# Patient Record
Sex: Female | Born: 1990 | Hispanic: Yes | Marital: Single | Smoking: Former smoker
Health system: Southern US, Community
[De-identification: ages and names within clinical notes are randomized; demographics above are authoritative.]

---

## 2010-01-05 ENCOUNTER — Emergency Department (HOSPITAL_COMMUNITY): Admission: EM | Admit: 2010-01-05 | Discharge: 2010-01-06 | Payer: Self-pay | Admitting: Emergency Medicine

## 2010-01-06 ENCOUNTER — Inpatient Hospital Stay (HOSPITAL_COMMUNITY): Admission: EM | Admit: 2010-01-06 | Discharge: 2010-01-11 | Payer: Self-pay | Admitting: Psychiatry

## 2010-01-06 ENCOUNTER — Ambulatory Visit: Payer: Self-pay | Admitting: Psychiatry

## 2010-11-29 LAB — URINALYSIS, ROUTINE W REFLEX MICROSCOPIC
Hgb urine dipstick: NEGATIVE
Nitrite: NEGATIVE
Specific Gravity, Urine: 1.017 (ref 1.005–1.030)
Urobilinogen, UA: 0.2 mg/dL (ref 0.0–1.0)

## 2010-11-29 LAB — BASIC METABOLIC PANEL
BUN: 8 mg/dL (ref 6–23)
Chloride: 104 mEq/L (ref 96–112)
Glucose, Bld: 84 mg/dL (ref 70–99)
Potassium: 4 mEq/L (ref 3.5–5.1)

## 2010-11-29 LAB — HEPATIC FUNCTION PANEL
AST: 22 U/L (ref 0–37)
Albumin: 3.7 g/dL (ref 3.5–5.2)
Alkaline Phosphatase: 58 U/L (ref 39–117)
Total Protein: 7.2 g/dL (ref 6.0–8.3)

## 2010-11-29 LAB — RAPID URINE DRUG SCREEN, HOSP PERFORMED: Cocaine: NOT DETECTED

## 2010-11-29 LAB — PREGNANCY, URINE: Preg Test, Ur: NEGATIVE

## 2010-11-29 LAB — URINE MICROSCOPIC-ADD ON

## 2010-11-29 LAB — DIFFERENTIAL
Eosinophils Absolute: 0.8 10*3/uL — ABNORMAL HIGH (ref 0.0–0.7)
Eosinophils Relative: 6 % — ABNORMAL HIGH (ref 0–5)
Lymphs Abs: 4 10*3/uL (ref 0.7–4.0)
Monocytes Absolute: 0.6 10*3/uL (ref 0.1–1.0)

## 2010-11-29 LAB — CBC
HCT: 38.8 % (ref 36.0–46.0)
MCV: 81.2 fL (ref 78.0–100.0)
Platelets: 395 10*3/uL (ref 150–400)
RDW: 13.6 % (ref 11.5–15.5)

## 2010-11-29 LAB — TRICYCLICS SCREEN, URINE: TCA Scrn: NOT DETECTED

## 2010-11-29 LAB — TSH: TSH: 3.472 u[IU]/mL (ref 0.350–4.500)

## 2016-04-08 ENCOUNTER — Emergency Department (HOSPITAL_COMMUNITY)
Admission: EM | Admit: 2016-04-08 | Discharge: 2016-04-08 | Disposition: A | Discharge status: Home / Self Care | Payer: BLUE CROSS/BLUE SHIELD | Attending: Emergency Medicine | Admitting: Emergency Medicine

## 2016-04-08 ENCOUNTER — Emergency Department (HOSPITAL_COMMUNITY): Payer: BLUE CROSS/BLUE SHIELD

## 2016-04-08 ENCOUNTER — Encounter (HOSPITAL_COMMUNITY): Payer: Self-pay

## 2016-04-08 DIAGNOSIS — Y939 Activity, unspecified: Secondary | ICD-10-CM | POA: Diagnosis not present

## 2016-04-08 DIAGNOSIS — S8002XA Contusion of left knee, initial encounter: Secondary | ICD-10-CM

## 2016-04-08 DIAGNOSIS — F17200 Nicotine dependence, unspecified, uncomplicated: Secondary | ICD-10-CM | POA: Diagnosis not present

## 2016-04-08 DIAGNOSIS — Y999 Unspecified external cause status: Secondary | ICD-10-CM | POA: Insufficient documentation

## 2016-04-08 DIAGNOSIS — S8992XA Unspecified injury of left lower leg, initial encounter: Secondary | ICD-10-CM | POA: Diagnosis present

## 2016-04-08 DIAGNOSIS — W1839XA Other fall on same level, initial encounter: Secondary | ICD-10-CM | POA: Insufficient documentation

## 2016-04-08 DIAGNOSIS — Y9289 Other specified places as the place of occurrence of the external cause: Secondary | ICD-10-CM | POA: Diagnosis not present

## 2016-04-08 MED ORDER — IBUPROFEN 800 MG PO TABS
800.0000 mg | ORAL_TABLET | Freq: Once | ORAL | Status: AC
  Administered 2016-04-08: 800 mg via ORAL
  Filled 2016-04-08: qty 1

## 2016-04-08 MED ORDER — IBUPROFEN 800 MG PO TABS: 800.0000 mg | ORAL_TABLET | Freq: Three times a day (TID) | ORAL | 0 refills | Status: DC
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
## 2016-10-15 ENCOUNTER — Emergency Department (HOSPITAL_COMMUNITY): Payer: BLUE CROSS/BLUE SHIELD

## 2016-10-15 ENCOUNTER — Encounter (HOSPITAL_COMMUNITY): Payer: Self-pay

## 2016-10-15 ENCOUNTER — Emergency Department (HOSPITAL_COMMUNITY)
Admission: EM | Admit: 2016-10-15 | Discharge: 2016-10-15 | Disposition: A | Discharge status: Home / Self Care | Payer: BLUE CROSS/BLUE SHIELD | Attending: Emergency Medicine | Admitting: Emergency Medicine

## 2016-10-15 DIAGNOSIS — S8992XA Unspecified injury of left lower leg, initial encounter: Secondary | ICD-10-CM | POA: Diagnosis present

## 2016-10-15 DIAGNOSIS — F17200 Nicotine dependence, unspecified, uncomplicated: Secondary | ICD-10-CM | POA: Insufficient documentation

## 2016-10-15 DIAGNOSIS — S9304XA Dislocation of right ankle joint, initial encounter: Secondary | ICD-10-CM | POA: Diagnosis not present

## 2016-10-15 DIAGNOSIS — Z79899 Other long term (current) drug therapy: Secondary | ICD-10-CM | POA: Diagnosis not present

## 2016-10-15 DIAGNOSIS — S8252XA Displaced fracture of medial malleolus of left tibia, initial encounter for closed fracture: Secondary | ICD-10-CM | POA: Diagnosis not present

## 2016-10-15 DIAGNOSIS — Y939 Activity, unspecified: Secondary | ICD-10-CM | POA: Diagnosis not present

## 2016-10-15 DIAGNOSIS — W010XXA Fall on same level from slipping, tripping and stumbling without subsequent striking against object, initial encounter: Secondary | ICD-10-CM | POA: Diagnosis not present

## 2016-10-15 DIAGNOSIS — S82892A Other fracture of left lower leg, initial encounter for closed fracture: Secondary | ICD-10-CM

## 2016-10-15 DIAGNOSIS — Y999 Unspecified external cause status: Secondary | ICD-10-CM | POA: Insufficient documentation

## 2016-10-15 DIAGNOSIS — T148XXA Other injury of unspecified body region, initial encounter: Secondary | ICD-10-CM

## 2016-10-15 DIAGNOSIS — S9305XA Dislocation of left ankle joint, initial encounter: Secondary | ICD-10-CM

## 2016-10-15 DIAGNOSIS — Y929 Unspecified place or not applicable: Secondary | ICD-10-CM | POA: Insufficient documentation

## 2016-10-15 MED ORDER — SODIUM CHLORIDE 0.9 % IV BOLUS (SEPSIS)
500.0000 mL | Freq: Once | INTRAVENOUS | Status: AC
  Administered 2016-10-15: 500 mL via INTRAVENOUS

## 2016-10-15 MED ORDER — KETAMINE HCL 10 MG/ML IJ SOLN
INTRAMUSCULAR | Status: DC | PRN
  Administered 2016-10-15: 110 mg via INTRAVENOUS

## 2016-10-15 MED ORDER — FENTANYL CITRATE (PF) 100 MCG/2ML IJ SOLN
50.0000 ug | Freq: Once | INTRAMUSCULAR | Status: AC
  Administered 2016-10-15: 50 ug via INTRAVENOUS
  Filled 2016-10-15: qty 2

## 2016-10-15 MED ORDER — HYDROCODONE-ACETAMINOPHEN 5-325 MG PO TABS: 1.0000 | ORAL_TABLET | Freq: Four times a day (QID) | ORAL | 0 refills | Status: DC | PRN

## 2016-10-15 MED ORDER — SODIUM CHLORIDE 0.9 % IV BOLUS (SEPSIS)
1000.0000 mL | Freq: Once | INTRAVENOUS | Status: AC
  Administered 2016-10-15: 1000 mL via INTRAVENOUS

## 2016-10-15 MED ORDER — PROPOFOL 10 MG/ML IV BOLUS: 0.5000 mg/kg | INTRAVENOUS | Status: DC | PRN

## 2016-10-15 MED ORDER — NAPROXEN 500 MG PO TABS: 500.0000 mg | ORAL_TABLET | Freq: Two times a day (BID) | ORAL | 0 refills | Status: DC

## 2016-10-15 MED ORDER — KETAMINE HCL-SODIUM CHLORIDE 100-0.9 MG/10ML-% IV SOSY
1.0000 mg/kg | PREFILLED_SYRINGE | Freq: Once | INTRAVENOUS | Status: AC
  Administered 2016-10-15: 113 mg via INTRAVENOUS
  Filled 2016-10-15: qty 20

## 2016-10-15 NOTE — Discharge Instructions (Signed)
Use the crutches, do not put weight on your foot, keep your leg elevated, apply ice to help with the swelling, take pain medications as needed, follow up with Dr Ranell PatrickNorris this week,

## 2016-10-15 NOTE — ED Triage Notes (Signed)
She states she fell this morning (didn't pass out) and doesn't recall exactly how she lost her balance. She c/o left ankle pain; which shows some swelling. CMS intact all toes bilat.

## 2016-10-15 NOTE — ED Notes (Signed)
Permit is signed; and pt. Remains in no distress. We are awaiting the arrival of the ortho. Tech. To begin the procedure.

## 2016-10-15 NOTE — ED Notes (Signed)
Bed: [id number] Expected date:  Expected time:  Means of arrival:  Comments: EMS Ankle injury

## 2016-10-15 NOTE — ED Provider Notes (Signed)
[facility] Provider Note   CSN: [id number] Arrival date & time: 10/15/16  0759     History   Chief Complaint Chief Complaint  Patient presents with  . Ankle Pain    HPI Stephanie Bryan is a 26 y.o. female.  HPI Patient presents to the emergency room with complaints of left ankle pain. The patient was walking this morning when she thinks she just slipped.  She did not hit her head, lose consciousness.  She fell to the ground injuring her left ankle.  Pt noted a deformity and could not stand.  She had to call EMS.  She denies any other injuries.  No LOC.  NO neck pain.  No chest pain.  No abdominal pain.  No numbness or weakness. No past medical history on file.  There are no active problems to display for this patient.   History reviewed. No pertinent surgical history.  OB History    No data available       Home Medications    Prior to Admission medications   Medication Sig Start Date End Date Taking? Authorizing Provider  HYDROcodone-acetaminophen (NORCO/VICODIN) 5-325 MG tablet Take 1 tablet by mouth every 6 (six) hours as needed. 10/15/16   Linwood Dibbles, MD  naproxen (NAPROSYN) 500 MG tablet Take 1 tablet (500 mg total) by mouth 2 (two) times daily. 10/15/16   Linwood Dibbles, MD    Family History No family history on file.  Social History Social History  Substance Use Topics  . Smoking status: Current Some Day Smoker  . Smokeless tobacco: Never Used  . Alcohol use Yes     Comment: socially     Allergies   Patient has no known allergies.   Review of Systems Review of Systems  All other systems reviewed and are negative.    Physical Exam Updated Vital Signs BP 150/96 (BP Location: Left Arm)   Pulse 110   Temp 97.8 F (36.6 C) (Oral)   Resp 17   Ht 5\' 1"  (1.549 m)   Wt 113.4 kg   SpO2 100%   BMI 47.24 kg/m   Physical Exam  Constitutional: She appears well-developed and well-nourished. No distress.  HENT:  Head: Normocephalic and  atraumatic.  Right Ear: External ear normal.  Left Ear: External ear normal.  Eyes: Conjunctivae are normal. Right eye exhibits no discharge. Left eye exhibits no discharge. No scleral icterus.  Neck: Neck supple. No tracheal deviation present.  Cardiovascular: Normal rate, regular rhythm and normal heart sounds.   Pulmonary/Chest: Effort normal. No stridor. No respiratory distress. She has no wheezes.  Abdominal: She exhibits no distension. There is no tenderness. There is no guarding.  Musculoskeletal: She exhibits no edema.       Left knee: Normal.       Left ankle: She exhibits swelling and deformity. Tenderness. Lateral malleolus and medial malleolus tenderness found.       Left foot: Normal.  Edema and deformity distal tib fib, left ankle region  Neurological: She is alert. Cranial nerve deficit: no gross deficits.  Skin: Skin is warm and dry. No rash noted.  Psychiatric: She has a normal mood and affect.  Nursing note and vitals reviewed.    ED Treatments / Results     Radiology Dg Ankle Complete Left  Result Date: 10/15/2016 CLINICAL DATA:  26 year old female status post fall this morning with left ankle pain and swelling. Initial encounter. EXAM: LEFT ANKLE COMPLETE - 3+ VIEW COMPARISON:  None. FINDINGS: Fracture  dislocation at the left mortise joint. Lateral and slightly posterior dislocation of the ankle joint in association with diastases of the tibiofibular syndesmosis and displaced comminuted fracture of the medial malleolus. Superimposed comminuted fracture of the left fibula distal third shaft with 1 full shaft width medial displacement, lateral angulation, and displaced butterfly fragment. The talar dome, lateral malleolus, and posterior malleolus appear to remain intact. Calcaneus appears intact. Other visualized left foot osseous structures appear intact. IMPRESSION: 1. Lateral and slightly posterior fracture-dislocation of the left ankle. 2. Disruption of the  tibiofibular syndesmosis. Comminuted and displaced fracture of the medial malleolus. Comminuted, displaced and angulated fracture of the distal third shaft of the left fibula. Electronically Signed   By: Odessa Fleming M.D.   On: 10/15/2016 08:24    Procedures .Sedation Date/Time: 10/15/2016 9:00 AM Performed by: Linwood Dibbles Authorized by: Linwood Dibbles   Consent:    Consent obtained:  Verbal   Consent given by:  Patient   Risks discussed:  Allergic reaction, dysrhythmia, inadequate sedation, nausea, vomiting, respiratory compromise necessitating ventilatory assistance and intubation, prolonged sedation necessitating reversal and prolonged hypoxia resulting in organ damage   Alternatives discussed:  Analgesia without sedation Indications:    Procedure performed:  Dislocation reduction   Procedure necessitating sedation performed by:  Physician performing sedation   Intended level of sedation:  Deep Pre-sedation assessment:    Time since last food or drink:  12 hours   ASA classification: class 1 - normal, healthy patient     Neck mobility: normal     Mouth opening:  2 finger widths   Thyromental distance:  3 finger widths   Mallampati score:  II - soft palate, uvula, fauces visible   Pre-sedation assessments completed and reviewed: airway patency, cardiovascular function, hydration status, mental status, nausea/vomiting, pain level, respiratory function and temperature     History of difficult intubation: no     Pre-sedation assessment completed:  10/15/2016 9:02 AM Immediate pre-procedure details:    Reassessment: Patient reassessed immediately prior to procedure     Reviewed: vital signs and NPO status     Verified: bag valve mask available, emergency equipment available, intubation equipment available, IV patency confirmed, oxygen available, reversal medications available and suction available   Procedure details (see MAR for exact dosages):    Sedation start time:  10/15/2016 10:14 AM    Preoxygenation:  Nasal cannula   Sedation:  Ketamine   Intra-procedure monitoring:  Blood pressure monitoring, cardiac monitor, continuous pulse oximetry, continuous capnometry, frequent LOC assessments and frequent vital sign checks   Intra-procedure events: none     Sedation end time:  10/15/2016 10:34 AM Post-procedure details:    Post-sedation assessment completed:  10/15/2016 11:09 AM   Attendance: Constant attendance by certified staff until patient recovered     Recovery: Patient returned to pre-procedure baseline     Estimated blood loss (see I/O flowsheets): no     Post-sedation assessments completed and reviewed: airway patency, cardiovascular function, hydration status, mental status, nausea/vomiting, pain level, respiratory function and temperature     Specimens recovered:  None   Patient is stable for discharge or admission: yes     Patient tolerance:  Tolerated well, no immediate complications     (including critical care time)  Medications Ordered in ED Medications  ketamine 100 mg in normal saline 10 mL (10mg /mL) syringe (not administered)  ketamine (KETALAR) injection (110 mg Intravenous Given 10/15/16 1019)  sodium chloride 0.9 % bolus 500 mL (500 mLs Intravenous New  Bag/Given 10/15/16 0935)  fentaNYL (SUBLIMAZE) injection 50 mcg (50 mcg Intravenous Given 10/15/16 0935)  sodium chloride 0.9 % bolus 1,000 mL (1,000 mLs Intravenous New Bag/Given 10/15/16 0936)     Initial Impression / Assessment and Plan / ED Course  I have reviewed the triage vital signs and the nursing notes.  Pertinent labs & imaging results that were available during my care of the patient were reviewed by me and considered in my medical decision making (see chart for details).  Clinical Course as of Oct 15 1110  Wynelle LinkSun Oct 15, 2016  [id number] Fracture dislocated noted.  Will need reduction.  Plan on sedation.  Discussed propofol and ketamine.  Pt is concerned that ketamine may not be effective since she has used  that recreationally in the past.   Will repeat BP after fluid bolus  [JK]  0947 BP is back in the mid 90s.  Pt has chronically low BP.  Will change to ketamine instead of propofol  [JK]    Clinical Course User Index [JK] Linwood DibblesJon Nataniel Gasper, MD    Patient sustained a fracture dislocation of her left ankle after a fall this morning.  Patient underwent procedural sedation and I was able to reduce the fracture dislocation.  I spoke with Alphonsa OverallBrad Dixon and the patient will follow up with Dr Ranell PatrickNorris this week.  Final Clinical Impressions(s) / ED Diagnoses   Final diagnoses:  Dislocation closed  Ankle dislocation, left, initial encounter  Closed fracture of left ankle, initial encounter    New Prescriptions New Prescriptions   HYDROCODONE-ACETAMINOPHEN (NORCO/VICODIN) 5-325 MG TABLET    Take 1 tablet by mouth every 6 (six) hours as needed.   NAPROXEN (NAPROSYN) 500 MG TABLET    Take 1 tablet (500 mg total) by mouth 2 (two) times daily.     Linwood DibblesJon Harry Bark, MD 10/15/16 1113

## 2018-06-17 ENCOUNTER — Ambulatory Visit: Payer: Self-pay | Admitting: Psychiatry

## 2018-06-24 ENCOUNTER — Encounter: Payer: Self-pay | Admitting: Emergency Medicine

## 2018-06-24 DIAGNOSIS — F321 Major depressive disorder, single episode, moderate: Secondary | ICD-10-CM | POA: Insufficient documentation

## 2018-06-24 DIAGNOSIS — F411 Generalized anxiety disorder: Secondary | ICD-10-CM

## 2018-06-24 DIAGNOSIS — G4700 Insomnia, unspecified: Secondary | ICD-10-CM

## 2018-07-01 ENCOUNTER — Ambulatory Visit: Payer: BLUE CROSS/BLUE SHIELD | Admitting: Physician Assistant

## 2018-07-01 ENCOUNTER — Ambulatory Visit: Payer: BLUE CROSS/BLUE SHIELD | Admitting: Psychiatry

## 2018-07-01 ENCOUNTER — Encounter: Payer: Self-pay | Admitting: Physician Assistant

## 2018-07-01 DIAGNOSIS — F411 Generalized anxiety disorder: Secondary | ICD-10-CM

## 2018-07-01 DIAGNOSIS — F331 Major depressive disorder, recurrent, moderate: Secondary | ICD-10-CM

## 2018-07-01 DIAGNOSIS — F321 Major depressive disorder, single episode, moderate: Secondary | ICD-10-CM | POA: Diagnosis not present

## 2018-07-01 MED ORDER — BUPROPION HCL ER (XL) 300 MG PO TB24: 300.0000 mg | ORAL_TABLET | Freq: Every day | ORAL | 1 refills | Status: DC

## 2018-07-01 MED ORDER — VORTIOXETINE HBR 10 MG PO TABS: 10.0000 mg | ORAL_TABLET | ORAL | 0 refills | Status: DC

## 2018-07-01 MED ORDER — HYDROXYZINE HCL 10 MG PO TABS: ORAL_TABLET | ORAL | 1 refills | Status: DC

## 2018-07-01 NOTE — Progress Notes (Signed)
[facility] Med Check  Patient ID: Stephanie Bryan,  MRN: [id number]  PCP: Patient, No Pcp Per  Date of Evaluation: 07/01/2018 Time spent:15 minutes   HISTORY/CURRENT STATUS: HPI About 25% better than LOV.  States she still cries easily on occasion and has low energy and motivation.  She does continue to work without missing days however.  She has to push herself to go out and half on but when she gets to a party or wherever, she does enjoy it.  She is sleeping well with the trazodone.  On nights that she does not sleep well, she is more anxious the next day.  She does often have a generalized sense of anxiety with uneasiness inside.  No shortness of breath but does have palpitations occasionally.  No sweats.  No increased energy with decreased need for sleep, no risky or impulsive behaviors.  No increased libido, no increased sleep, no grandiosity, no increased irritability.  She is seeing Wynelle Bourgeois, [facility] in counseling which is helpful.  Individual Medical History/ Review of Systems: Changes? :No  Allergies: Patient has no known allergies.  Current Medications:  Current Outpatient Medications:  .  traZODone (DESYREL) 50 MG tablet, Take 50 mg by mouth at bedtime as needed for sleep., Disp: , Rfl:  .  vortioxetine HBr (TRINTELLIX) 10 MG TABS tablet, Take 1 tablet (10 mg total) by mouth every morning., Disp: 90 tablet, Rfl: 0 .  buPROPion (WELLBUTRIN XL) 300 MG 24 hr tablet, Take 1 tablet (300 mg total) by mouth daily., Disp: 30 tablet, Rfl: 1 .  HYDROcodone-acetaminophen (NORCO/VICODIN) 5-325 MG tablet, Take 1 tablet by mouth every 6 (six) hours as needed. (Patient not taking: Reported on 07/01/2018), Disp: 12 tablet, Rfl: 0 .  hydrOXYzine (ATARAX/VISTARIL) 10 MG tablet, 1-2 po tid prn anxiety, Disp: 90 tablet, Rfl: 1 .  naproxen (NAPROSYN) 500 MG tablet, Take 1 tablet (500 mg total) by mouth 2 (two) times daily., Disp: 20 tablet, Rfl: 0 Medication Side Effects: None  Family  Medical/ Social History: Changes? No  MENTAL HEALTH EXAM:  There were no vitals taken for this visit.There is no height or weight on file to calculate BMI.  General Appearance: Well Groomed obese  Eye Contact:  Good  Speech:  Clear and Coherent  Volume:  Normal  Mood:  Depressed  Affect:  Depressed  Thought Process:  Goal Directed  Orientation:  Full (Time, Place, and Person)  Thought Content: Logical   Suicidal Thoughts:  No  Homicidal Thoughts:  No  Memory:  Immediate  Judgement:  Good  Insight:  Good  Psychomotor Activity:  Normal  Concentration:  Concentration: Good  Recall:  Good  Fund of Knowledge: Good  Language: Good  Akathisia:  No  AIMS (if indicated): not done  Assets:  Desire for Improvement  ADL's:  Intact  Cognition: WNL  Prognosis:  Good    DIAGNOSES:    ICD-10-CM   1. Major depressive disorder, recurrent episode, moderate (HCC) F33.1   2. Generalized anxiety disorder F41.1     RECOMMENDATIONS: We will increase Wellbutrin XL 300 mg every morning.  Hopefully this will help with the motivation and energy especially.  There is a possibility it might increase anxiety however and she knows to watch for that.  Will start hydroxyzine for as needed use for anxiety.  She will call if symptoms occur. Continue psychotherapy with Wynelle Bourgeois, LPC Return in 4 to 6 weeks.    Melony Overly, PA-C

## 2018-07-01 NOTE — Progress Notes (Signed)
      [facility] Counselor/Therapist Progress Note   Patient ID: Bellarose Alonzo Loving, MRN: [id number]  Date: 07/01/2018  Timespent: 50 minutes  Treatment Type: Individual  Subjective: The client reports that she has restarted her meds of Trintellix and Wellbutrin.  Today she is not sure how she feels.  It has been about 26 days that she is been on the meds.  We discussed that a probably another week or so before she really notices a difference.  The client has been having thoughts of death but nothing identified as suicidal thoughts.  Her negative belief is, "things are not going to be okay", which generates a sense of despair and fear that she feels in her throat.  We used eye movement today to begin to process through those emotions that she rated at a subjective units of distress 5 and ended at less than 2.  The client sees that she needs to do things to replenish herself and do self-care.  She finds that she gives out too much to others then finds herself drained and overwhelmed.  She needs to discern what she has control over and what she does not have control over (international events, Psychologist, clinical).  The client will do some gardening, spend time with friends and her sister.  She will also begin to work on addressing her spiritual needs.  Interventions:CBT, Solution Focused, Psychosocial Skills: Boundaries, Supportive, Reframing and Other: EMDR  Mental Status Exam:   Appearance:   Casual     Behavior:  Appropriate  Motor:  Normal  Speech/Language:   Clear and Coherent  Affect:  Appropriate  Mood:  anxious and depressed  Thought process:  Coherent  Thought content:    Logical  Perceptual disturbances:    Normal  Orientation:  Full (Time, Place, and Person)  Attention:  Good  Concentration:  good  Memory:  Immediate  Fund of knowledge:   Good  Insight:    Good  Judgment:   Good  Impulse Control:  good    Reported Symptoms: Sad, anxious  Risk Assessment: Danger to  Self:  No Self-injurious Behavior: No Danger to Others: No Duty to Warn:no Physical Aggression / Violence:No  Access to Firearms a concern: No  Gang Involvement:No   Diagnosis:   ICD-10-CM   1. Major depressive disorder, single episode, moderate (HCC) F32.1      Plan: Self-care, exercise, continue with med's.  Gelene Mink Starsha [place], [place]

## 2018-07-29 ENCOUNTER — Ambulatory Visit: Payer: BLUE CROSS/BLUE SHIELD | Admitting: Psychiatry

## 2018-07-29 ENCOUNTER — Encounter: Payer: Self-pay | Admitting: Psychiatry

## 2018-07-29 DIAGNOSIS — F331 Major depressive disorder, recurrent, moderate: Secondary | ICD-10-CM

## 2018-07-29 NOTE — Progress Notes (Signed)
      [facility] Counselor/Therapist Progress Note   Patient ID: Lisabeth Mia CreekLucia Belleau, MRN: [id number]  Date: 07/29/2018    Timespent: 56 minutes    Treatment Type: Individual   Reported Symptoms: Sleep disturbance, Isolation and withdrawal, Fatigue and Lack of motivation   Mental Status Exam:    Appearance:   Casual     Behavior:  Appropriate  Motor:  Normal  Speech/Language:   Clear and Coherent  Affect:  Appropriate  Mood:  depressed and sad  Thought process:  normal  Thought content:    WNL  Sensory/Perceptual disturbances:    WNL  Orientation:  oriented to person, place, time/date and situation  Attention:  Good  Concentration:  Good  Memory:  WNL  Fund of knowledge:   Good  Insight:    Good  Judgment:   Good  Impulse Control:  Good     Risk Assessment: Danger to Self:  No Self-injurious Behavior: No Danger to Others: No Duty to Warn:no Physical Aggression / Violence:No  Access to Firearms a concern: No  Gang Involvement:No    Subjective: The client reports that the time change has been manageable for her.  She states, "I have done okay."  She notes it is still hard to do things.  Her sleep has not been that great.  She states, "if I take my trazodone I sleep better."  We discussed that the clients low mood Saban Heinlen be related to her lack of sleep.  I requested to the client that she take her trazodone each night for the next 7 days and try to get at least 7 hours worth of sleep.  She agreed to do so. She reports that trintillix helps some she is also taking Wellbutrin 150 mg.  She states that she will call Melony Overlyeresa Hurst to see if she can increase the Wellbutrin.  "I am lonely I am falling behind."  The client compares herself to her peers and feels lacking.  We discussed that she has been through so much that it is important for her to have acceptance with where she is.  She agrees to try to take it one step at a time.  The client will engage in more pleasurable  activities and try to get at least 30 minutes of sun exposure of a day.  That in addition to better sleep will hopefully improve her mood overall.  The client denies any suicidal or homicidal ideation.  I used eye movement desensitization and reprocessing with the client around her low mood.  Her negative belief was, "I do not want to do anything".  Her subjective units of distress was at a 6+ and her emotions were sadness.  As the client processed she was able to reduce her subjective units of distress to the below 2.  She also saw that there were things she could do to improve where she was.   Interventions: Solution-Oriented/Positive Psychology, Eye Movement Desensitization and Reprocessing (EMDR) and Insight-Oriented   Diagnosis:   ICD-10-CM   1. Major depressive disorder, recurrent episode, moderate (HCC) F33.1      Plan: exercise, sleep, sun exposure, pleasurable activities.   Gelene MinkFrederick Tykeria [place], [place]

## 2018-08-12 ENCOUNTER — Ambulatory Visit: Payer: BLUE CROSS/BLUE SHIELD | Admitting: Psychiatry

## 2018-08-12 ENCOUNTER — Encounter: Payer: Self-pay | Admitting: Psychiatry

## 2018-08-12 DIAGNOSIS — F331 Major depressive disorder, recurrent, moderate: Secondary | ICD-10-CM | POA: Diagnosis not present
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
## 2018-08-14 ENCOUNTER — Ambulatory Visit: Payer: BLUE CROSS/BLUE SHIELD | Admitting: Physician Assistant

## 2018-08-26 ENCOUNTER — Ambulatory Visit: Payer: BLUE CROSS/BLUE SHIELD | Admitting: Psychiatry

## 2018-08-26 ENCOUNTER — Encounter: Payer: Self-pay | Admitting: Psychiatry

## 2018-08-26 DIAGNOSIS — F331 Major depressive disorder, recurrent, moderate: Secondary | ICD-10-CM | POA: Diagnosis not present

## 2018-08-26 NOTE — Progress Notes (Signed)
      [facility] Counselor/Therapist Progress Note  Patient ID: Stephanie Bryan, MRN: [id number],    Date: 08/26/2018  Time Spent: 58 minutes   Treatment Type: Individual Therapy  Reported Symptoms: Depressed mood and Anxious Mood  Mental Status Exam:  Appearance:   Casual and Well Groomed     Behavior:  Appropriate  Motor:  Normal  Speech/Language:   Clear and Coherent  Affect:  Appropriate  Mood:  anxious and sad  Thought process:  normal  Thought content:    WNL  Sensory/Perceptual disturbances:    WNL  Orientation:  oriented to person, place, time/date and situation  Attention:  Good  Concentration:  Good  Memory:  WNL  Fund of knowledge:   Good  Insight:    Good  Judgment:   Good  Impulse Control:  Good   Risk Assessment: Danger to Self:  No Self-injurious Behavior: No Danger to Others: No Duty to Warn:no Physical Aggression / Violence:No  Access to Firearms a concern: No  Gang Involvement:No   Subjective: The client reports that her anxiety and depressed mood are just about the same.  The holidays are difficult because the client is worried about weight gain.  She has difficulty being able to lose the weight she would like.  The client states, "I think about my weight all the time." We discussed at length body image issues that the client has.  We discussed what the culture expected versus what is actually accurate.  The client was able to get to a place of acceptance that weight loss would have to be done persistently over a period of time.  The client has continued to be able to do activities that give her a sense of satisfaction.  These are cooking, gardening, and mild exercise.  The client subjective units of distress started at 5 and was at less than 2 at the end of the session.  Interventions: Cognitive Behavioral Therapy, Solution-Oriented/Positive Psychology and Insight-Oriented  Diagnosis:   ICD-10-CM   1. Major depressive disorder, recurrent  episode, moderate (HCC) F33.1     Plan: Exercise, self-care, positive self talk.  Stephanie Bryan, [place]

## 2018-09-05 ENCOUNTER — Other Ambulatory Visit: Payer: Self-pay | Admitting: Physician Assistant

## 2018-09-09 ENCOUNTER — Ambulatory Visit: Payer: BLUE CROSS/BLUE SHIELD | Admitting: Psychiatry

## 2018-09-09 ENCOUNTER — Encounter: Payer: Self-pay | Admitting: Psychiatry

## 2018-09-09 DIAGNOSIS — F331 Major depressive disorder, recurrent, moderate: Secondary | ICD-10-CM

## 2018-09-09 NOTE — Progress Notes (Signed)
      [facility] Counselor/Therapist Progress Note  Patient ID: Stephanie Bryan, MRN: [id number],    Date: 09/09/2018  Time Spent: 45 minutes   Treatment Type: Individual Therapy  Reported Symptoms: Depressed mood and Anxious Mood  Mental Status Exam:  Appearance:   Casual and Well Groomed     Behavior:  Appropriate  Motor:  Normal  Speech/Language:   Clear and Coherent  Affect:  Tearful  Mood:  anxious, depressed and sad  Thought process:  normal  Thought content:    WNL  Sensory/Perceptual disturbances:    WNL  Orientation:  oriented to person, place, time/date and situation  Attention:  Good  Concentration:  Good  Memory:  WNL  Fund of knowledge:   Good  Insight:    Good  Judgment:   Good  Impulse Control:  Good   Risk Assessment: Danger to Self:  No Self-injurious Behavior: No Danger to Others: No Duty to Warn:no Physical Aggression / Violence:No  Access to Firearms a concern: No  Gang Involvement:No   Subjective: The client states that she saw her mom and aunt over the Christmas holiday.  She notes that she has been incredibly busy between work and other obligations.  Today she plans to see her dad in [place] and spend the afternoon with him.  "I am exhausted and stressed."  The client also states that she has a lot of negative thoughts such as, "I hate myself."  Today I used eye movement desensitization and reprocessing with the client around this negative cognition.  She felt sadness and anxiety in her chest and was quite tearful.  Her subjective units of distress equaled 7+.  As the client processed the negative thought, "I hate myself", she was able to reduce the intensity of it.  She realized that there were a lot of people in her life who loved her and cared for her.  As that realization sank in her subjective units of distress dropped below 3.  The client wil try to stay focused on the present tense and tell herself the truth.  Interventions:  Solution-Oriented/Positive Psychology, Eye Movement Desensitization and Reprocessing (EMDR) and Insight-Oriented  Diagnosis:   ICD-10-CM   1. Major depressive disorder, recurrent episode, moderate (HCC) F33.1     Plan: Positive self talk, self-care, sleep.  Gelene MinkFrederick Kinan Safley, [place]

## 2018-11-07 ENCOUNTER — Ambulatory Visit: Payer: BLUE CROSS/BLUE SHIELD | Admitting: Psychiatry

## 2018-11-07 ENCOUNTER — Encounter: Payer: Self-pay | Admitting: Psychiatry

## 2018-11-07 DIAGNOSIS — F4310 Post-traumatic stress disorder, unspecified: Secondary | ICD-10-CM | POA: Diagnosis not present
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
## 2018-11-11 ENCOUNTER — Ambulatory Visit (INDEPENDENT_AMBULATORY_CARE_PROVIDER_SITE_OTHER): Payer: BLUE CROSS/BLUE SHIELD | Admitting: Psychiatry

## 2018-11-11 ENCOUNTER — Encounter: Payer: Self-pay | Admitting: Psychiatry

## 2018-11-11 DIAGNOSIS — F331 Major depressive disorder, recurrent, moderate: Secondary | ICD-10-CM | POA: Diagnosis not present
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
## 2018-11-25 ENCOUNTER — Ambulatory Visit: Payer: BLUE CROSS/BLUE SHIELD | Admitting: Psychiatry

## 2018-11-25 ENCOUNTER — Other Ambulatory Visit: Payer: Self-pay

## 2018-11-25 ENCOUNTER — Encounter: Payer: Self-pay | Admitting: Psychiatry

## 2018-11-25 DIAGNOSIS — F331 Major depressive disorder, recurrent, moderate: Secondary | ICD-10-CM

## 2018-11-25 NOTE — Progress Notes (Signed)
      [facility] Counselor/Therapist Progress Note  Patient ID: Stephanie Bryan, MRN: [id number],    Date: 11/25/2018  Time Spent: 49 minutes   Treatment Type: Individual Therapy  Reported Symptoms: fatigue, sadness, negative cognitions.  Mental Status Exam:  Appearance:   Casual and Well Groomed     Behavior:  Appropriate  Motor:  Normal  Speech/Language:   Clear and Coherent  Affect:  Depressed  Mood:  depressed and sad  Thought process:  normal  Thought content:    WNL  Sensory/Perceptual disturbances:    WNL  Orientation:  oriented to person, place, time/date and situation  Attention:  Good  Concentration:  Good  Memory:  WNL  Fund of knowledge:   Good  Insight:    Good  Judgment:   Good  Impulse Control:  Good   Risk Assessment: Danger to Self:  No Self-injurious Behavior: No Danger to Others: No Duty to Warn:no Physical Aggression / Violence:No  Access to Firearms a concern: No  Gang Involvement:No   Subjective: The client reports that she has been very fatigued.  Because of that she does not feel like she can do what she needs to do or do the things she wants.  Her tendency is to reinterpret this as a moral failure or character flaw.  I floated the idea that the client has not seen her primary care physician in a long time.  Something as simple as a B12 shot Etoile Looman help increase her energy as well as looking at supplementation to help with what could be mitochondrial fatigue.  The client is interested and will look into this. Today we discussed the client's very negative beliefs about herself and how she views herself.  We talked about using distraction and focusing on other tasks as a way to keep herself on track redirecting her thoughts away from the negative and more towards a positive. 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  This would be a good fit for the things that she is interested in.  Interventions:  Solution-Oriented/Positive Psychology and Insight-Oriented  Diagnosis:   ICD-10-CM   1. Major depressive disorder, recurrent episode, moderate (HCC) F33.1     Plan: Evaluate aesthetician programs, positive self talk, self-care, exercise, pleasurable activities.  This record has been created using [employer].  Chart creation errors have been sought, but Oshae Simmering not always have been located and corrected. Such creation errors do not reflect on the standard of medical care.   Jayden Kratochvil, [place]

## 2018-12-09 ENCOUNTER — Ambulatory Visit (INDEPENDENT_AMBULATORY_CARE_PROVIDER_SITE_OTHER): Payer: BLUE CROSS/BLUE SHIELD | Admitting: Psychiatry

## 2018-12-09 ENCOUNTER — Other Ambulatory Visit: Payer: Self-pay

## 2018-12-09 ENCOUNTER — Encounter: Payer: Self-pay | Admitting: Psychiatry

## 2018-12-09 DIAGNOSIS — F331 Major depressive disorder, recurrent, moderate: Secondary | ICD-10-CM | POA: Diagnosis not present

## 2018-12-09 NOTE — Progress Notes (Signed)
      [facility] Counselor/Therapist Progress Note  Patient ID: Stephanie Bryan, MRN: [id number],    Date: 12/09/2018  Time Spent: 56 minutes   Treatment Type: Individual Therapy  Reported Symptoms: anxiety, stress, sad.  Mental Status Exam:  Appearance:   Casual and Well Groomed     Behavior:  Appropriate  Motor:  Normal  Speech/Language:   Clear and Coherent  Affect:  Appropriate  Mood:  anxious and sad  Thought process:  normal  Thought content:    WNL  Sensory/Perceptual disturbances:    WNL  Orientation:  oriented to person, place, time/date and situation  Attention:  Good  Concentration:  Good  Memory:  WNL  Fund of knowledge:   Good  Insight:    Good  Judgment:   Good  Impulse Control:  Good   Risk Assessment: Danger to Self:  No Self-injurious Behavior: No Danger to Others: No Duty to Warn:no Physical Aggression / Violence:No  Access to Firearms a concern: No  Gang Involvement:No   Subjective: I connected with patient by a video enabled telemedicine application or telephone, with their informed consent, and verified patient privacy and that I am speaking with the correct person using two identifiers.  I was located at [facility] and patient at home. The client is stressed out with the pandemic.  She works at a [employer] and they have been, "super busy."  She is finds herself getting annoyed with people especially when they are ignoring the protocols concerning the coronavirus that have a laissez-faire attitude towards this epidemic. I discussed with the client what she is doing to manage her anxiety and depression.  She states that she has work toward having a schedule each day.  She tries to include tasks that she enjoys and wants that give her some refreshment.  These include things such as watering her garden, building a compost been, and baking bread.  She is able to spend some time with friends which has been positive.  She is  very aware of the social distancing aspect.  I encouraged the client to increase her exercise.  She does report she has been doing some body weight exercises each day since she has the time. The client will continue to stay mindful.  She gets worried that she is not able to help others the way that she would like.  I stressed to her the importance of working locally and doing what she can.  She agrees.   Interventions: Assertiveness/Communication, Solution-Oriented/Positive Psychology and Insight-Oriented  Diagnosis:   ICD-10-CM   1. Major depressive disorder, recurrent episode, moderate (HCC) F33.1     Plan: self care, mindfulness, boundaries, assertiveness.  This record has been created using [employer].  Chart creation errors have been sought, but [name] not always have been located and corrected. Such creation errors do not reflect on the standard of medical care.   [name], [place]

## 2018-12-19 ENCOUNTER — Encounter: Payer: Self-pay | Admitting: Psychiatry

## 2018-12-19 ENCOUNTER — Ambulatory Visit (INDEPENDENT_AMBULATORY_CARE_PROVIDER_SITE_OTHER): Payer: BLUE CROSS/BLUE SHIELD | Admitting: Psychiatry

## 2018-12-19 ENCOUNTER — Other Ambulatory Visit: Payer: Self-pay

## 2018-12-19 DIAGNOSIS — F331 Major depressive disorder, recurrent, moderate: Secondary | ICD-10-CM

## 2018-12-19 NOTE — Progress Notes (Signed)
      [facility] Counselor/Therapist Progress Note  Patient ID: Steven Maira Doble, MRN: [id number],    Date: 12/19/2018  Time Spent: 56 minutes   Treatment Type: Individual Therapy  Reported Symptoms: anxiety, irritable sad, overwhelmed.  Mental Status Exam:  Appearance:   NA     Behavior:  Appropriate  Motor:  Normal  Speech/Language:   Clear and Coherent  Affect:  Appropriate  Mood:  anxious, irritable and sad  Thought process:  normal  Thought content:    WNL  Sensory/Perceptual disturbances:    WNL  Orientation:  oriented to person, place, time/date and situation  Attention:  Good  Concentration:  Good  Memory:  WNL  Fund of knowledge:   Good  Insight:    Good  Judgment:   Good  Impulse Control:  Good   Risk Assessment: Danger to Self:  No Self-injurious Behavior: No Danger to Others: No Duty to Warn:no Physical Aggression / Violence:No  Access to Firearms a concern: No  Gang Involvement:No   Subjective: I connected with patient by a video enabled telemedicine application or telephone, with their informed consent, and verified patient privacy and that I am speaking with the correct person using two identifiers.  I was located at [facility] and patient at home. The client is overwhelmed with the current pandemic.  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  She has found customers coming in coughing sneezing without regard for her or the other employees at work there.  This is made her very angry.  She does not know much in terms of the dynamics of the coronavirus pandemic.  To her it feels overwhelming and too powerful. I went over some of the statistics with the client to educate her about the mortality rate of the virus as well as average age of people who have died.  She was surprised that the average age was 30 and that in her age range her chances of dying were 0.2%.  That relieved quite a bit of her anxiety.  Also since  [place] has been active the sheltering place order it is flattening the curve and is sitting quite a bit.  Compared to [place], [place] has almost 3 times as many cases.  [place] has a times this many cases.  The percentage of people that have it compared to our population is 0.00003%.  This gave the client quite a bit of comfort and calmed her considerably.  Her subjective units of distress went from an 8+ to less than 1 at the end of the session.    Interventions: Assertiveness/Communication, Solution-Oriented/Positive Psychology and Insight-Oriented  Diagnosis:   ICD-10-CM   1. Major depressive disorder, recurrent episode, moderate (HCC) F33.1     Plan: self care, exercise, positive self talk, boundaries, assertiveness.  This record has been created using [employer].  Chart creation errors have been sought, but Cyani Kallstrom not always have been located and corrected. Such creation errors do not reflect on the standard of medical care.   Gelene Mink Dorthea Maina, [facility]

## 2019-01-10 ENCOUNTER — Ambulatory Visit (INDEPENDENT_AMBULATORY_CARE_PROVIDER_SITE_OTHER): Payer: BLUE CROSS/BLUE SHIELD | Admitting: Psychiatry

## 2019-01-10 ENCOUNTER — Telehealth: Payer: Self-pay | Admitting: Physician Assistant

## 2019-01-10 ENCOUNTER — Other Ambulatory Visit: Payer: Self-pay

## 2019-01-10 ENCOUNTER — Encounter: Payer: Self-pay | Admitting: Psychiatry

## 2019-01-10 DIAGNOSIS — F331 Major depressive disorder, recurrent, moderate: Secondary | ICD-10-CM | POA: Diagnosis not present

## 2019-01-10 MED ORDER — BUPROPION HCL ER (XL) 150 MG PO TB24
150.0000 mg | ORAL_TABLET | Freq: Every day | ORAL | 1 refills | Status: DC
Start: 2019-01-10 — End: 2019-03-07

## 2019-01-10 NOTE — Telephone Encounter (Signed)
Submitted to pharmacy 

## 2019-01-10 NOTE — Telephone Encounter (Signed)
Spoke with pt. And she agrees and would like to restart Wellbutrin. Traci please send in. Thank you.

## 2019-01-10 NOTE — Progress Notes (Signed)
[facility] Counselor/Therapist Progress Note  Patient ID: Stephanie Bryan, MRN: [id number],    Date: 01/10/2019  Time Spent: 55 minutes   Treatment Type: Individual Therapy  Reported Symptoms: sadness, tearfulness, anxiety, stress, lonely.  Mental Status Exam:  Appearance:   Casual and Well Groomed     Behavior:  Appropriate  Motor:  Normal  Speech/Language:   Clear and Coherent  Affect:  Appropriate  Mood:  anxious, depressed, sad and tearful  Thought process:  normal  Thought content:    WNL  Sensory/Perceptual disturbances:    WNL  Orientation:  oriented to person, place, time/date and situation  Attention:  Good  Concentration:  Good  Memory:  WNL  Fund of knowledge:   Good  Insight:    Good  Judgment:   Good  Impulse Control:  Good   Risk Assessment: Danger to Self:  No Self-injurious Behavior: No Danger to Others: No Duty to Warn:no Physical Aggression / Violence:No  Access to Firearms a concern: No  Gang Involvement:No   Subjective: Telehealth visit I connected with patient by a video enabled telemedicine/telehealth application or telephone, with her informed consent, and verified patient privacy and that I am speaking with the correct person using two identifiers.  I was located at my office and patient at her home.  We discussed the limitations, risks, and security and privacy concerns associated with telehealth services and the availability of in-person appointments, including awareness that she Drevin Ortner be responsible for charges related to the service, and she expressed understanding and agreed to proceed.  I discussed treatment planning with her, with opportunity to ask and answer all questions. Agreed with the plan, demonstrated an understanding of the instructions, and made her aware to call our office if symptoms worsen or she feels she is in a crisis state and needs immediate contact.  "Things have become a little easier, but not great.  I am  working about the same.  I've been quickly cycling through a lot of emotions."  Client reports that she has been overwhelmed with the ongoing pandemic and the level of work that she has to complete at her job.  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  She states the customers come in and do not follow the social distancing guidelines and are rude to the employees.  "I am burned out with work."  She states she tries to do the activities that refresh her but she feels that she has not had the same level satisfaction with them. The client reports that she had discontinued her Wellbutrin and Trintellix.  She had run out of both medications.  The cost to get the Trintellix refilled was over $200 per month.  This is outside of the clients financial means.  She did agree to make follow-up appointment with Melony Overly, PA-C and discuss what other antidepressant could be paired with the Wellbutrin as successfully as the Trintellix.  I believe the client is crashing because of going off the medication and the high level of stress that she is experiencing. I used eye movement with the client today focusing on her work, "there is so much to do".  Her negative cognition is, "I am too overwhelmed."  She feels frustration stress sadness and anxiety in her chest or subjective units of distress is an 8 at the end of the session it was less than 3.  As the client processed she  expressed thoughts about not liking herself.  These were not suicidal in nature but more a result of the major depression.  The client says that she needs to take more active steps to get things done.  She agrees to call the office today to make the appointment with the physician's assistant.  Interventions: Assertiveness/Communication, Solution-Oriented/Positive Psychology, Eye Movement Desensitization and Reprocessing (EMDR) and Insight-Oriented  Diagnosis:   ICD-10-CM   1. Major depressive disorder,  recurrent episode, moderate (HCC) F33.1     Plan: Follow-up appointment with Melony Overlyeresa Hurst, PA-C, self-care, sun exposure, exercise, engaged activities, social network.  This record has been created using [employer] software.  Chart creation errors have been sought, but Keiron Iodice not always have been located and corrected. Such creation errors do not reflect on the standard of medical care.  Gelene MinkFrederick Damione Robideau, [facility]

## 2019-01-31 ENCOUNTER — Ambulatory Visit: Payer: BLUE CROSS/BLUE SHIELD | Admitting: Psychiatry

## 2019-02-10 ENCOUNTER — Ambulatory Visit: Payer: BLUE CROSS/BLUE SHIELD | Admitting: Psychiatry

## 2019-02-28 ENCOUNTER — Ambulatory Visit: Payer: BLUE CROSS/BLUE SHIELD | Admitting: Psychiatry

## 2019-03-07 ENCOUNTER — Other Ambulatory Visit: Payer: Self-pay | Admitting: Physician Assistant

## 2019-03-10 ENCOUNTER — Ambulatory Visit: Payer: BLUE CROSS/BLUE SHIELD | Admitting: Psychiatry

## 2019-03-24 ENCOUNTER — Ambulatory Visit: Payer: BLUE CROSS/BLUE SHIELD | Admitting: Psychiatry

## 2019-04-21 ENCOUNTER — Ambulatory Visit: Payer: BLUE CROSS/BLUE SHIELD | Admitting: Psychiatry

## 2019-05-12 ENCOUNTER — Ambulatory Visit: Payer: BLUE CROSS/BLUE SHIELD | Admitting: Psychiatry

## 2019-05-14 ENCOUNTER — Other Ambulatory Visit: Payer: Self-pay | Admitting: Physician Assistant

## 2019-05-14 NOTE — Telephone Encounter (Signed)
Leda Gauze, please call her and set up appt for f/u w/ me.  I'll go ahead and ok this Rx.

## 2019-05-14 NOTE — Telephone Encounter (Signed)
Did not follow up in Nov. 2019

## 2019-05-15 NOTE — Telephone Encounter (Signed)
Had to LM to call the office for an appt.  Will not be able to do refills again until seen.

## 2019-07-14 ENCOUNTER — Other Ambulatory Visit: Payer: Self-pay

## 2019-07-14 DIAGNOSIS — Z20822 Contact with and (suspected) exposure to covid-19: Secondary | ICD-10-CM

## 2019-07-16 LAB — NOVEL CORONAVIRUS, NAA: SARS-CoV-2, NAA: NOT DETECTED

## 2019-07-28 ENCOUNTER — Other Ambulatory Visit: Payer: Self-pay

## 2019-07-28 DIAGNOSIS — Z20822 Contact with and (suspected) exposure to covid-19: Secondary | ICD-10-CM

## 2019-07-29 LAB — NOVEL CORONAVIRUS, NAA: SARS-CoV-2, NAA: NOT DETECTED

## 2020-02-23 ENCOUNTER — Encounter: Payer: Self-pay | Admitting: Psychiatry

## 2020-02-23 ENCOUNTER — Other Ambulatory Visit: Payer: Self-pay

## 2020-02-23 ENCOUNTER — Ambulatory Visit (INDEPENDENT_AMBULATORY_CARE_PROVIDER_SITE_OTHER): Payer: 59 | Admitting: Psychiatry

## 2020-02-23 DIAGNOSIS — F331 Major depressive disorder, recurrent, moderate: Secondary | ICD-10-CM

## 2020-02-23 NOTE — Progress Notes (Signed)
      [facility] Counselor/Therapist Progress Note  Patient ID: Stephanie Bryan, MRN: [id number],    Date: 02/23/2020  Time Spent: 50 minutes   Treatment Type: Individual Therapy  Reported Symptoms: sad, anxious, depressed  Mental Status Exam:  Appearance:   Well Groomed     Behavior:  Appropriate  Motor:  Normal  Speech/Language:   Clear and Coherent  Affect:  Appropriate  Mood:  anxious, depressed and sad  Thought process:  normal  Thought content:    WNL  Sensory/Perceptual disturbances:    WNL  Orientation:  oriented to person, place, time/date and situation  Attention:  Good  Concentration:  Good  Memory:  WNL  Fund of knowledge:   Good  Insight:    Good  Judgment:   Good  Impulse Control:  Good   Risk Assessment: Danger to Self:  No Self-injurious Behavior: No Danger to Others: No Duty to Warn:no Physical Aggression / Violence:No  Access to Firearms a concern: No  Gang Involvement:No   Subjective: I have not seen the client for 1 year due to a break in her insurance coverage.  She has also had to discontinue her psychiatric medications because of the inability to afford them.  Today she states that she has recently broken up with her longtime boyfriend.  There have been a series of events that have led to this.  Earlier this year the client and her boyfriend invited a third woman into their sexual relationship.  Since that has happened the client realizes that she is more attracted to women than she is men.  "I am not sure if I am straight but I am not sure if I am gay either."  Her new partner is an older 31 year old woman.  She states their communication is clear and direct which she is not used to.  Today we focused on her anxiety around this new relationship and her sadness around the end of her old relationship.  As she processed with eye-movement the client was able to reduce her level of disturbance from the subjective units of distress of 6 to less than  3.  She realized that "maybe good things could be for me."  This is not something that she has considered in the past.  She states she feels a certain level of guilt over her new relationship.  I encouraged the client to take things slowly and allow herself to evaluate how she feels or how she is doing.  Radical acceptance with her current circumstances will allow her to move through things a little easier.  I also explained that it was important to use mood independent behavior to get the things done that she needs to do.  She states that she has done this by addressing her debt issues with finances and solving those.  This is a huge move forward by the client.  I encouraged her to continue to take on things she once thought impossible for herself.  She agreed to do so.  Interventions: Assertiveness/Communication, Motivational Interviewing, Solution-Oriented/Positive Psychology, [employer] Desensitization and Reprocessing (EMDR) and Insight-Oriented  Diagnosis:   ICD-10-CM   1. Major depressive disorder, recurrent episode, moderate (HCC)  F33.1     Plan: Mood independent behavior, self-care, positive self talk, radical acceptance, intentionality, assertiveness, boundaries.  Gelene Mink Elisama Thissen, [facility]

## 2020-04-05 ENCOUNTER — Other Ambulatory Visit: Payer: Self-pay

## 2020-04-05 ENCOUNTER — Encounter: Payer: Self-pay | Admitting: Psychiatry

## 2020-04-05 ENCOUNTER — Ambulatory Visit (INDEPENDENT_AMBULATORY_CARE_PROVIDER_SITE_OTHER): Payer: 59 | Admitting: Psychiatry

## 2020-04-05 DIAGNOSIS — F331 Major depressive disorder, recurrent, moderate: Secondary | ICD-10-CM | POA: Diagnosis not present

## 2020-04-05 NOTE — Progress Notes (Signed)
      [facility] Counselor/Therapist Progress Note  Patient ID: Stephanie Bryan, MRN: [id number],    Date: 04/05/2020  Time Spent: 50 minutes   Treatment Type: Individual Therapy  Reported Symptoms: sad  Mental Status Exam:  Appearance:   Well Groomed     Behavior:  Appropriate  Motor:  Normal  Speech/Language:   Clear and Coherent  Affect:  Appropriate  Mood:  sad  Thought process:  normal  Thought content:    WNL  Sensory/Perceptual disturbances:    WNL  Orientation:  oriented to person, place, time/date and situation  Attention:  Good  Concentration:  Good  Memory:  WNL  Fund of knowledge:   Good  Insight:    Good  Judgment:   Good  Impulse Control:  Good   Risk Assessment: Danger to Self:  No Self-injurious Behavior: No Danger to Others: No Duty to Warn:no Physical Aggression / Violence:No  Access to Firearms a concern: No  Gang Involvement:No   Subjective: The client reports that she has been tracking her cycle.  "I realize I get really depressed around my period."  I suggested to the client that she follow-up with her primary care physician because there is a treatment for that using a low-dose of fluoxetine hydrochloride.  The client agreed to.  The client's relationship with a new girlfriend seems to be going well for her.  The client has found herself much more assertive.  She texted her girlfriend stating she needed more attention and that she missed her.  "It almost made me throw up."  Her girlfriend responded in a very affirming positive way.  I congratulated the client for practicing mood independent behavior and being able to do that.  She Kalani Sthilaire be worth it more than she thinks.  "I believe I am finally coming around to that understanding."  Today I used eye-movement with the client focusing on her girlfriend.  Her negative cognition was, "am I worth it?"  She feels fear in her chest.  Her subjective units of distress is a 5.  As the client processed, she  agreed that her girlfriend accepts her unconditionally.  She made the connection that her family does not approve of how she dresses or the make-up that she uses.  The client admittedly is very flamboyant and dresses in a way that  epitomizes hyperbole.  She agreed.  We discussed the need for the client to have radical acceptance of who she is.  She does not need to live her life for others approval because that leads to bad outcomes for her.  She notes that her self-loathing is always present.  I challenged the client to try and stay in the present tense mindfully being okay with what she is doing at the moment.  As long as her behavior is not patently immoral it is just a different choice.  This made sense to the client.  Her subjective units of distress at the end of the session was less than 2.  Interventions: Assertiveness/Communication, Mindfulness Meditation, Motivational Interviewing, Solution-Oriented/Positive Psychology, [employer] Desensitization and Reprocessing (EMDR) and Insight-Oriented  Diagnosis:   ICD-10-CM   1. Major depressive disorder, recurrent episode, moderate (HCC)  F33.1     Plan: Mindfulness, radical acceptance, mood independent behavior, assertiveness, boundaries, self-care, positive self talk.  Stephanie Bryan Maxden Naji, [facility]

## 2020-05-03 ENCOUNTER — Ambulatory Visit (INDEPENDENT_AMBULATORY_CARE_PROVIDER_SITE_OTHER): Payer: 59 | Admitting: Psychiatry

## 2020-05-03 ENCOUNTER — Encounter: Payer: Self-pay | Admitting: Psychiatry

## 2020-05-03 ENCOUNTER — Other Ambulatory Visit: Payer: Self-pay

## 2020-05-03 DIAGNOSIS — F331 Major depressive disorder, recurrent, moderate: Secondary | ICD-10-CM | POA: Diagnosis not present
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
## 2020-05-24 ENCOUNTER — Ambulatory Visit (INDEPENDENT_AMBULATORY_CARE_PROVIDER_SITE_OTHER): Payer: 59 | Admitting: Physician Assistant

## 2020-05-24 ENCOUNTER — Other Ambulatory Visit: Payer: Self-pay

## 2020-05-24 ENCOUNTER — Encounter: Payer: Self-pay | Admitting: Physician Assistant

## 2020-05-24 DIAGNOSIS — F4310 Post-traumatic stress disorder, unspecified: Secondary | ICD-10-CM | POA: Diagnosis not present

## 2020-05-24 DIAGNOSIS — Z1589 Genetic susceptibility to other disease: Secondary | ICD-10-CM

## 2020-05-24 DIAGNOSIS — F331 Major depressive disorder, recurrent, moderate: Secondary | ICD-10-CM | POA: Diagnosis not present

## 2020-05-24 DIAGNOSIS — F411 Generalized anxiety disorder: Secondary | ICD-10-CM | POA: Diagnosis not present

## 2020-05-24 DIAGNOSIS — E7212 Methylenetetrahydrofolate reductase deficiency: Secondary | ICD-10-CM | POA: Diagnosis not present

## 2020-05-24 DIAGNOSIS — E6609 Other obesity due to excess calories: Secondary | ICD-10-CM

## 2020-05-24 MED ORDER — DESVENLAFAXINE SUCCINATE ER 50 MG PO TB24: 50.0000 mg | ORAL_TABLET | Freq: Every day | ORAL | 1 refills | Status: DC

## 2020-05-24 MED ORDER — ALPRAZOLAM 0.5 MG PO TABS: 0.5000 mg | ORAL_TABLET | Freq: Two times a day (BID) | ORAL | 1 refills | Status: DC | PRN

## 2020-05-24 MED ORDER — L-METHYLFOLATE-B6-B12 3-35-2 MG PO TABS: 1.0000 | ORAL_TABLET | Freq: Every day | ORAL | 1 refills | Status: DC

## 2020-05-24 NOTE — Progress Notes (Signed)
[facility] Med Check  Patient ID: Stephanie Bryan,  MRN: [id number]  PCP: Patient, No Pcp Per  Date of Evaluation: 05/24/2020 Time spent:40 minutes   HISTORY/CURRENT STATUS: HPI To discuss getting back on meds.  Lost to f/u d/t insurance issues.  Last seen 06/30/18.  She went off her psychiatric medications partly due to insurance issues but also she was feeling better.  Things have changed a lot now and she feels the need to go back on medication.  In May, she broke up with her partner of 7 years.  That was very difficult.  She has been crying easily, not enjoying things as much as she used to.  Not isolating but does prefer to be alone after work.  Sleep is fair.  Denies suicidal or homicidal thoughts.  She also wants to discuss possible ADD and maybe getting on a stimulant if appropriate.  Has times where she loses stuff, like her keys, can't focus to read, gets distracted really easy, has a hard time finishing things.  Was not diagnosed with ADD/ADHD in the past.  She continues to have problems with her weight, even though she is trying hard to limit calories.  That is difficult because she does not want to do much of anything.  Exercises difficult right now.  She has a history of binge eating but no purging.  She does get pretty anxious especially in social situations.  Not so much having panic attacks as just a generalized sense of unease, like something bad is going to happen.  If she is not in her comfort zone, she will get sweaty and have palpitations at times.  Patient denies increased energy with decreased need for sleep, no increased talkativeness, no racing thoughts, no impulsivity or risky behaviors, no increased spending, no increased libido, no grandiosity, no increased irritability or anger, and no hallucinations.  Denies dizziness, syncope, seizures, numbness, tingling, tremor, tics, unsteady gait, slurred speech, confusion. Denies muscle or joint pain, stiffness,  or dystonia.  Individual Medical History/ Review of Systems: Changes? :No  Past medications for mental health diagnoses include: Wellbutrin, Prozac, Lithium, Seroquel, Trazodone, Klonopin, Xanax, Geodon, Risperdal, Saphris, Haldol, Trintellix, Hydroxyzine.  Allergies: Patient has no known allergies.  Current Medications:  Current Outpatient Medications:  .  cholecalciferol (VITAMIN D3) 25 MCG (1000 UNIT) tablet, Take 1,000 Units by mouth daily., Disp: , Rfl:  .  Multiple Vitamin (MULTIVITAMIN) tablet, Take 1 tablet by mouth daily., Disp: , Rfl:  .  ALPRAZolam (XANAX) 0.5 MG tablet, Take 1 tablet (0.5 mg total) by mouth 2 (two) times daily as needed for anxiety., Disp: 30 tablet, Rfl: 1 .  desvenlafaxine (PRISTIQ) 50 MG 24 hr tablet, Take 1 tablet (50 mg total) by mouth daily., Disp: 30 tablet, Rfl: 1 .  l-methylfolate-B6-B12 (METANX) 3-35-2 MG TABS tablet, Take 1 tablet by mouth daily., Disp: 30 tablet, Rfl: 1 .  traZODone (DESYREL) 50 MG tablet, Take 50 mg by mouth at bedtime as needed for sleep. (Patient not taking: Reported on 05/24/2020), Disp: , Rfl:  Medication Side Effects: None  Family Medical/ Social History: Changes?  In May, broke up with her partner of 7 years  MENTAL HEALTH EXAM:  There were no vitals taken for this visit.There is no height or weight on file to calculate BMI.  General Appearance: Casual, Neat, Well Groomed and obese   Eye Contact:  Good  Speech:  Clear and Coherent  Volume:  Normal  Mood:  Depressed  Affect:  Depressed  Thought  Process:  Goal Directed  Orientation:  Full (Time, Place, and Person)  Thought Content: Logical   Suicidal Thoughts:  No  Homicidal Thoughts:  No  Memory:  Immediate  Judgement:  Good  Insight:  Good  Psychomotor Activity:  Normal  Concentration:  Concentration: Good and Attention Span: Good  Recall:  Good  Fund of Knowledge: Good  Language: Good  Akathisia:  No  AIMS (if indicated): not done  Assets:  Desire for  Improvement  ADL's:  Intact  Cognition: WNL  Prognosis:  Good   Gene site test results from 2019 were reviewed and discussed with patient.  MTHFR was abnormal.  DIAGNOSES:    ICD-10-CM   1. Major depressive disorder, recurrent episode, moderate (HCC)  F33.1   2. PTSD (post-traumatic stress disorder)  F43.10   3. Generalized anxiety disorder  F41.1   4. MTHFR gene mutation (HCC)  E72.12   5. Obesity due to excess calories, unspecified classification, unspecified whether serious comorbidity present  E66.09       Plan: PDMP reviewed.  I provided 40 mins of face to face time during this encounter. I recommend treatment for the depression first and foremost.  Depression itself can cause forgetfulness and that decreased concentration and focus to the point of inability to finish tasks in a timely manner.  After reviewing GeneSight test results, I recommend starting Pristiq.  She has never used that before and according to the genetic test results, she should respond to it well.  Benefits, risks, side effects were discussed and she accepts and would like to proceed.  We will start at low dose and increase as needed and as tolerated. Treating her with a stimulant is an option in the future but I think the depression is the most important problem to treat right now.  If we do begin a stimulant, I recommend starting with a long-acting such as Vyvanse, Concerta, Adderall XR, or many others. Discussed MTHFR and ways to treat.  That supplement can be extremely expensive and we typically send a prescription to a pharmacy that provides that "medical food" cheaper than buying it at a drugstore.  She would like to at least see if she can have the supplement covered before we go the route just described.   Sleep hygiene was briefly discussed. She will continue to work on calorie restriction and trying to exercise even if only taking a walk after work.  The Pristiq might possibly cause increased hunger and  weight gain but in my experience, when someone knows that that is a risk factor, it is much less likely to cause weight gain.  That is due to the fact healthier food choices will be made as well as increased exercise no matter how little. All questions were answered to her satisfaction. Start Pristiq 50 mg, 1 p.o. daily. Start Xanax 0.5 mg, 1 p.o. twice daily as needed. Start Metanx 1 p.o. daily. Continue psychotherapy with Wynelle Bourgeois, LPC Return in 4 to 6 weeks.  Melony Overly, PA-C

## 2020-05-26 ENCOUNTER — Encounter: Payer: Self-pay | Admitting: Physician Assistant

## 2020-07-05 ENCOUNTER — Encounter: Payer: Self-pay | Admitting: Psychiatry

## 2020-07-05 ENCOUNTER — Ambulatory Visit (INDEPENDENT_AMBULATORY_CARE_PROVIDER_SITE_OTHER): Payer: 59 | Admitting: Physician Assistant

## 2020-07-05 ENCOUNTER — Other Ambulatory Visit: Payer: Self-pay

## 2020-07-05 ENCOUNTER — Encounter: Payer: Self-pay | Admitting: Physician Assistant

## 2020-07-05 ENCOUNTER — Ambulatory Visit: Payer: 59 | Admitting: Psychiatry

## 2020-07-05 DIAGNOSIS — Z1589 Genetic susceptibility to other disease: Secondary | ICD-10-CM

## 2020-07-05 DIAGNOSIS — F331 Major depressive disorder, recurrent, moderate: Secondary | ICD-10-CM | POA: Diagnosis not present

## 2020-07-05 DIAGNOSIS — F4310 Post-traumatic stress disorder, unspecified: Secondary | ICD-10-CM | POA: Diagnosis not present

## 2020-07-05 DIAGNOSIS — F411 Generalized anxiety disorder: Secondary | ICD-10-CM

## 2020-07-05 DIAGNOSIS — E6609 Other obesity due to excess calories: Secondary | ICD-10-CM

## 2020-07-05 MED ORDER — TRAZODONE HCL 50 MG PO TABS: 50.0000 mg | ORAL_TABLET | Freq: Every evening | ORAL | 1 refills | Status: DC | PRN

## 2020-07-05 MED ORDER — ALPRAZOLAM 0.5 MG PO TABS: 0.5000 mg | ORAL_TABLET | Freq: Two times a day (BID) | ORAL | 1 refills | Status: DC | PRN

## 2020-07-05 MED ORDER — DESVENLAFAXINE SUCCINATE ER 100 MG PO TB24: 100.0000 mg | ORAL_TABLET | Freq: Every day | ORAL | 1 refills | Status: DC
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
## 2020-07-05 NOTE — Progress Notes (Signed)
[facility] Med Check  Patient ID: Stephanie Bryan,  MRN: [id number]  PCP: Patient, No Pcp Per  Date of Evaluation: 07/05/2020 Time spent:20 minutes   HISTORY/CURRENT STATUS: HPI for routine med check.  Has been a little bit better since we started the Pristiq. Is having more good days than bad.  She is able to enjoy things a bit better than she had been and she is not crying as much.  Energy and motivation are still pretty low but is getting better.  She actually did some things around her home yesterday that were fine.  She did a little bit of cleaning and straightening in preparation of making a room into a home office.  And she did some cooking that she enjoyed.  Some nights she does not sleep well and the trazodone is helpful.  She does not take it every night.  She does have occasional passive suicidal thoughts but states she is not serious and has no plans.  No homicidal thoughts.  She has had to take the Xanax since the last visit and that is hard for her.  She does not want to have to take it but sometimes she will get so anxious that she cannot help it.  When she feels overwhelmed, like something bad is going to happen, she takes it.  It is helpful.  She is really worried about tolerance issues.  She is not taking it every day and 30 pills has lasted over 6 weeks.    Patient denies increased energy with decreased need for sleep, no increased talkativeness, no racing thoughts, no impulsivity or risky behaviors, no increased spending, no increased libido, no grandiosity, no increased irritability or anger, and no hallucinations.  Denies dizziness, syncope, seizures, numbness, tingling, tremor, tics, unsteady gait, slurred speech, confusion. Denies muscle or joint pain, stiffness, or dystonia.  Individual Medical History/ Review of Systems: Changes? :No  Past medications for mental health diagnoses include: Wellbutrin, Prozac, Lithium, Seroquel, Trazodone, Klonopin, Xanax,  Geodon, Risperdal, Saphris, Haldol, Trintellix, Hydroxyzine, Effexor, Pristiq  Allergies: Patient has no known allergies.  Current Medications:  Current Outpatient Medications:  .  ALPRAZolam (XANAX) 0.5 MG tablet, Take 1 tablet (0.5 mg total) by mouth 2 (two) times daily as needed for anxiety., Disp: 30 tablet, Rfl: 1 .  cholecalciferol (VITAMIN D3) 25 MCG (1000 UNIT) tablet, Take 1,000 Units by mouth daily., Disp: , Rfl:  .  l-methylfolate-B6-B12 (METANX) 3-35-2 MG TABS tablet, Take 1 tablet by mouth daily., Disp: 30 tablet, Rfl: 1 .  Multiple Vitamin (MULTIVITAMIN) tablet, Take 1 tablet by mouth daily., Disp: , Rfl:  .  traZODone (DESYREL) 50 MG tablet, Take 1 tablet (50 mg total) by mouth at bedtime as needed for sleep., Disp: 90 tablet, Rfl: 1 .  desvenlafaxine (PRISTIQ) 100 MG 24 hr tablet, Take 1 tablet (100 mg total) by mouth daily., Disp: 30 tablet, Rfl: 1 Medication Side Effects: None  Family Medical/ Social History: Changes? No  MENTAL HEALTH EXAM:  There were no vitals taken for this visit.There is no height or weight on file to calculate BMI.  General Appearance: Casual, Neat, Well Groomed and obese   Eye Contact:  Good  Speech:  Clear and Coherent  Volume:  Normal  Mood:  Depressed  Affect:  Depressed  Thought Process:  Goal Directed  Orientation:  Full (Time, Place, and Person)  Thought Content: Logical   Suicidal Thoughts:  No  Homicidal Thoughts:  No  Memory:  Immediate  Judgement:  Good  Insight:  Good  Psychomotor Activity:  Normal  Concentration:  Concentration: Good and Attention Span: Good  Recall:  Good  Fund of Knowledge: Good  Language: Good  Akathisia:  No  AIMS (if indicated): not done  Assets:  Desire for Improvement  ADL's:  Intact  Cognition: WNL  Prognosis:  Good   Gene site test results from 2019 were reviewed and discussed with patient.  MTHFR was abnormal.  DIAGNOSES:    ICD-10-CM   1. Major depressive disorder, recurrent episode,  moderate (HCC)  F33.1   2. MTHFR gene mutation  Z15.89   3. PTSD (post-traumatic stress disorder)  F43.10   4. Generalized anxiety disorder  F41.1   5. Obesity due to excess calories, unspecified classification, unspecified whether serious comorbidity present  E66.09       Plan: PDMP reviewed.  I provided 20 mins of face to face time during this encounter. I'm glad she's doing some better. We're on the right path. I recommend increasing the Pristiq.  Increase Pristiq to 100 mg qd. Continue Xanax 0.5 mg, 1 p.o. twice daily as needed. Continue Trazodone 50 mg 1 qhs prn sleep.  Continue Metanx 1 p.o. daily. Continue psychotherapy with Sherron Monday, [facility] Return in 4-6 weeks.  Melony Overly, PA-C

## 2020-07-19 ENCOUNTER — Encounter: Payer: Self-pay | Admitting: Psychiatry

## 2020-07-19 ENCOUNTER — Ambulatory Visit (INDEPENDENT_AMBULATORY_CARE_PROVIDER_SITE_OTHER): Payer: 59 | Admitting: Psychiatry

## 2020-07-19 DIAGNOSIS — F331 Major depressive disorder, recurrent, moderate: Secondary | ICD-10-CM | POA: Diagnosis not present

## 2020-07-19 NOTE — Progress Notes (Signed)
      [facility] Counselor/Therapist Progress Note  Patient ID: Stephanie Bryan, MRN: [id number],    Date: 07/19/2020  Time Spent: 45 minutes   Treatment Type: Individual Therapy  Reported Symptoms: anxious, sad   Mental Status Exam:  Appearance:   Casual     Behavior:  Appropriate  Motor:  Normal  Speech/Language:   Clear and Coherent  Affect:  Appropriate  Mood:  anxious and sad  Thought process:  normal  Thought content:    WNL  Sensory/Perceptual disturbances:    WNL  Orientation:  oriented to person, place, time/date and situation  Attention:  Good  Concentration:  Good  Memory:  WNL  Fund of knowledge:   Good  Insight:    Good  Judgment:   Good  Impulse Control:  Good   Risk Assessment: Danger to Self:  No Self-injurious Behavior: No Danger to Others: No Duty to Warn:no Physical Aggression / Violence:No  Access to Firearms a concern: No  Gang Involvement:No   Subjective: The client stated that she recently attended a [address] [employer] in [place], [place]. She stated she really enjoyed herself but her date did not go so well. She realizes today that she is very tired and worn out. As an introvert she just needs some time by herself. The client reports that her overall mood has significantly increased. She recently had an increase in her Effexor. She also has been taking the l-methylfolate for the last few months and notices an increase in her energy as well. "I haven't felt this good ever." The client admits that she still has some sadness and became tearful as she thought about the past. She also has some anxiety but not to the extent that she has experienced in the past. As we discussed how she felt the client agreed that she has more agency and autonomy over her life. She is not quite sure if she can accept that. We discussed the concept of radical acceptance with her better mood. I pointed out to the client that she has been stressed out since she  was a young child. As a result of the trauma she experienced she has been burning up more neurotransmitters that her brain has been able to produce. With the advent of them antidepressant that works she is starting to get some margin. This is more how she should be feeling. The client notes that she is making better choices and not feeling the guilt and sadness that she had in the past. Throughout this we used eye-movement with the client focusing on the improvement in her mood. Her positive cognition at the end of the session was, "I get to choose."  Interventions: Assertiveness/Communication, Motivational Interviewing, Solution-Oriented/Positive Psychology, [employer] Desensitization and Reprocessing (EMDR) and Insight-Oriented  Diagnosis:   ICD-10-CM   1. Major depressive disorder, recurrent episode, moderate (HCC)  F33.1     Plan: Radical acceptance, mood independent behavior, autonomy, assertiveness, boundaries.  Stephanie Bryan, [facility]

## 2020-08-02 ENCOUNTER — Ambulatory Visit (INDEPENDENT_AMBULATORY_CARE_PROVIDER_SITE_OTHER): Payer: 59 | Admitting: Psychiatry

## 2020-08-02 ENCOUNTER — Encounter: Payer: Self-pay | Admitting: Psychiatry

## 2020-08-02 ENCOUNTER — Other Ambulatory Visit: Payer: Self-pay

## 2020-08-02 DIAGNOSIS — F331 Major depressive disorder, recurrent, moderate: Secondary | ICD-10-CM | POA: Diagnosis not present

## 2020-08-02 NOTE — Progress Notes (Signed)
°      [facility] Counselor/Therapist Progress Note  Patient ID: Stephanie Bryan, MRN: [id number],    Date: 08/02/2020  Time Spent: 45 minutes   Treatment Type: Individual Therapy  Reported Symptoms: anxiety, depression  Mental Status Exam:  Appearance:   Casual     Behavior:  Appropriate  Motor:  Normal  Speech/Language:   Clear and Coherent  Affect:  Appropriate  Mood:  anxious and depressed  Thought process:  normal  Thought content:    WNL  Sensory/Perceptual disturbances:    WNL  Orientation:  oriented to person, place, time/date and situation  Attention:  Good  Concentration:  Good  Memory:  WNL  Fund of knowledge:   Good  Insight:    Good  Judgment:   Good  Impulse Control:  Good   Risk Assessment: Danger to Self:  No Self-injurious Behavior: No Danger to Others: No Duty to Warn:no Physical Aggression / Violence:No  Access to Firearms a concern: No  Gang Involvement:No   Subjective: Client states that she is doing better all the time.  She realizes that her bad days are related to PMS.  She has started to track her cycle.  She finds that there are 2 days where she is "miserable".  She can now predict when those happen.  She states she is seeking to mitigate those symptoms using natural supplements such as black cohosh, wild yams or progesterone cream. The client reports that she has more energy as she continues to take the Effexor and methyl folate.  "I have been able to lose weight.  I been saving money."  This has been remarkable to the client that she has been able to do these things.  She recently took a train to [place] to visit friends.  In the past she would have been very anxious but this trip was anticlimactic.  "I found it lovely."  We discussed how the client has more power and control.  The client agrees that she is not used to this.  She had a date with a young man in [place].  She stated that it went very well.  "I do not have to commit if I do  not want to."  This is a huge turnaround for the client not to be caught up in codependent behavior.  As the client processed these things today she used the bilateral stimulation hand paddles.  Her subjective units of distress went from a 4 to less than 1.  Her positive cognition at the end of the session was, "I am in charge of my life."  Interventions: Assertiveness/Communication, Motivational Interviewing, Solution-Oriented/Positive Psychology, [employer] Desensitization and Reprocessing (EMDR) and Insight-Oriented  Diagnosis:   ICD-10-CM   1. Major depressive disorder, recurrent episode, moderate (HCC)  F33.1     Plan: Mood independent behavior, positive self talk, self-care, exercise, boundaries, assertiveness.  Gelene Mink Hodaya Curto, [facility]

## 2020-08-09 ENCOUNTER — Ambulatory Visit: Payer: 59 | Admitting: Physician Assistant

## 2020-08-10 ENCOUNTER — Other Ambulatory Visit: Payer: Self-pay | Admitting: Physician Assistant

## 2020-09-13 ENCOUNTER — Telehealth: Payer: Self-pay | Admitting: Physician Assistant

## 2020-09-13 ENCOUNTER — Ambulatory Visit: Payer: 59 | Admitting: Psychiatry

## 2020-09-13 NOTE — Telephone Encounter (Signed)
PT called with request for RF on Desvenlafaxine 100mg  . APT 1/19

## 2020-09-14 ENCOUNTER — Other Ambulatory Visit: Payer: Self-pay | Admitting: Physician Assistant

## 2020-09-15 NOTE — Telephone Encounter (Signed)
Rx was sent  

## 2020-09-23 ENCOUNTER — Emergency Department (HOSPITAL_COMMUNITY): Payer: 59

## 2020-09-23 ENCOUNTER — Ambulatory Visit
Admission: EM | Admit: 2020-09-23 | Discharge: 2020-09-23 | Disposition: A | Discharge status: Other Acute Inpatient Hospital | Payer: 59

## 2020-09-23 ENCOUNTER — Emergency Department (HOSPITAL_COMMUNITY)
Admission: EM | Admit: 2020-09-23 | Discharge: 2020-09-24 | Disposition: A | Discharge status: Home / Self Care | Payer: 59 | Attending: Emergency Medicine | Admitting: Emergency Medicine

## 2020-09-23 ENCOUNTER — Other Ambulatory Visit: Payer: Self-pay

## 2020-09-23 ENCOUNTER — Encounter (HOSPITAL_COMMUNITY): Payer: Self-pay | Admitting: Emergency Medicine

## 2020-09-23 DIAGNOSIS — Z79899 Other long term (current) drug therapy: Secondary | ICD-10-CM | POA: Insufficient documentation

## 2020-09-23 DIAGNOSIS — R202 Paresthesia of skin: Secondary | ICD-10-CM | POA: Diagnosis present

## 2020-09-23 DIAGNOSIS — G510 Bell's palsy: Secondary | ICD-10-CM | POA: Diagnosis not present

## 2020-09-23 DIAGNOSIS — J0120 Acute ethmoidal sinusitis, unspecified: Secondary | ICD-10-CM | POA: Insufficient documentation

## 2020-09-23 DIAGNOSIS — Z87891 Personal history of nicotine dependence: Secondary | ICD-10-CM | POA: Diagnosis not present

## 2020-09-23 LAB — I-STAT CHEM 8, ED
BUN: 12 mg/dL (ref 6–20)
Calcium, Ion: 1.32 mmol/L (ref 1.15–1.40)
Chloride: 103 mmol/L (ref 98–111)
Creatinine, Ser: 0.6 mg/dL (ref 0.44–1.00)
Glucose, Bld: 127 mg/dL — ABNORMAL HIGH (ref 70–99)
HCT: 41 % (ref 36.0–46.0)
Hemoglobin: 13.9 g/dL (ref 12.0–15.0)
Potassium: 4.1 mmol/L (ref 3.5–5.1)
Sodium: 139 mmol/L (ref 135–145)
TCO2: 28 mmol/L (ref 22–32)

## 2020-09-23 LAB — COMPREHENSIVE METABOLIC PANEL
ALT: 31 U/L (ref 0–44)
AST: 28 U/L (ref 15–41)
Albumin: 4 g/dL (ref 3.5–5.0)
Alkaline Phosphatase: 43 U/L (ref 38–126)
Anion gap: 9 (ref 5–15)
BUN: 9 mg/dL (ref 6–20)
CO2: 25 mmol/L (ref 22–32)
Calcium: 9.6 mg/dL (ref 8.9–10.3)
Chloride: 101 mmol/L (ref 98–111)
Creatinine, Ser: 0.71 mg/dL (ref 0.44–1.00)
GFR, Estimated: >60 mL/min (ref >60)
Glucose, Bld: 130 mg/dL — ABNORMAL HIGH (ref 70–99)
Potassium: 3.9 mmol/L (ref 3.5–5.1)
Sodium: 135 mmol/L (ref 135–145)
Total Bilirubin: 0.3 mg/dL (ref 0.3–1.2)
Total Protein: 7.8 g/dL (ref 6.5–8.1)

## 2020-09-23 LAB — CBC
HCT: 39.1 % (ref 36.0–46.0)
Hemoglobin: 13.1 g/dL (ref 12.0–15.0)
MCH: 28.5 pg (ref 26.0–34.0)
MCHC: 33.5 g/dL (ref 30.0–36.0)
MCV: 85.2 fL (ref 80.0–100.0)
Platelets: 438 10*3/uL — ABNORMAL HIGH (ref 150–400)
RBC: 4.59 MIL/uL (ref 3.87–5.11)
RDW: 13 % (ref 11.5–15.5)
WBC: 11.1 10*3/uL — ABNORMAL HIGH (ref 4.0–10.5)
nRBC: 0 % (ref 0.0–0.2)

## 2020-09-23 LAB — PROTIME-INR
INR: 1 (ref 0.8–1.2)
Prothrombin Time: 12.4 seconds (ref 11.4–15.2)

## 2020-09-23 LAB — DIFFERENTIAL
Abs Immature Granulocytes: 0.06 10*3/uL (ref 0.00–0.07)
Basophils Absolute: 0 10*3/uL (ref 0.0–0.1)
Basophils Relative: 0 %
Eosinophils Absolute: 0.2 10*3/uL (ref 0.0–0.5)
Eosinophils Relative: 1 %
Immature Granulocytes: 1 %
Lymphocytes Relative: 27 %
Lymphs Abs: 3 10*3/uL (ref 0.7–4.0)
Monocytes Absolute: 0.6 10*3/uL (ref 0.1–1.0)
Monocytes Relative: 5 %
Neutro Abs: 7.3 10*3/uL (ref 1.7–7.7)
Neutrophils Relative %: 66 %

## 2020-09-23 LAB — APTT: aPTT: 30 seconds (ref 24–36)

## 2020-09-23 LAB — I-STAT BETA HCG BLOOD, ED (MC, WL, AP ONLY): I-stat hCG, quantitative: <5 m[IU]/mL (ref <5)

## 2020-09-23 MED ORDER — SODIUM CHLORIDE 0.9% FLUSH: 3.0000 mL | Freq: Once | INTRAVENOUS | Status: DC

## 2020-09-23 NOTE — ED Triage Notes (Signed)
Patient complains of facial droop that started on Tuesday and has gotten worse. Patient cannot move right sided of mouth and right eyebrow. States she had right neck swelling and right ear pain the started approximately one week ago.

## 2020-09-24 MED ORDER — PREDNISONE 20 MG PO TABS
60.0000 mg | ORAL_TABLET | Freq: Once | ORAL | Status: AC
  Administered 2020-09-24: 60 mg via ORAL
  Filled 2020-09-24: qty 3

## 2020-09-24 MED ORDER — AMOXICILLIN 500 MG PO CAPS: 500.0000 mg | ORAL_CAPSULE | Freq: Three times a day (TID) | ORAL | 0 refills | Status: DC

## 2020-09-24 MED ORDER — VALACYCLOVIR HCL 1 G PO TABS: 1000.0000 mg | ORAL_TABLET | Freq: Three times a day (TID) | ORAL | 0 refills | Status: DC

## 2020-09-24 MED ORDER — VALACYCLOVIR HCL 500 MG PO TABS
1000.0000 mg | ORAL_TABLET | ORAL | Status: AC
  Administered 2020-09-24: 1000 mg via ORAL
  Filled 2020-09-24: qty 2

## 2020-09-24 MED ORDER — PREDNISONE 20 MG PO TABS
60.0000 mg | ORAL_TABLET | Freq: Every day | ORAL | 0 refills | Status: DC
Start: 2020-09-24 — End: 2020-12-27

## 2020-09-24 NOTE — ED Provider Notes (Signed)
[facility] EMERGENCY DEPARTMENT Provider Note   CSN: [id number] Arrival date & time: 09/23/20  1727     History Chief Complaint  Patient presents with  . Facial Droop    Stephanie Bryan is a 30 y.o. female.  Patient reports that she has had numbness and decreased ability to move the right side of her face starting 2 days ago.  No numbness, tingling, weakness of extremities.  No difficulty with speech.  She reports that she has been sick for more than a week with head cold symptoms.  She has sinus congestion, right ear pain and sore throat.  No rash on the right side of her face.  She took a COVID test and it was negative.        History reviewed. No pertinent past medical history.  Patient Active Problem List   Diagnosis Date Noted  . Major depressive disorder, single episode, moderate (HCC) 06/24/2018  . Insomnia 06/24/2018  . GAD (generalized anxiety disorder) 06/24/2018    History reviewed. No pertinent surgical history.   OB History   No obstetric history on file.     No family history on file.  Social History   Tobacco Use  . Smoking status: Former Smoker    Quit date: 10/01/2016    Years since quitting: 3.9  . Smokeless tobacco: Never Used  . Tobacco comment: rarely will vape   Substance Use Topics  . Alcohol use: Yes    Alcohol/week: 0.0 - 1.0 standard drinks    Comment: maybe 2 per month  . Drug use: Yes    Types: Marijuana    Comment: occas    Home Medications Prior to Admission medications   Medication Sig Start Date End Date Taking? Authorizing Provider  amoxicillin (AMOXIL) 500 MG capsule Take 1 capsule (500 mg total) by mouth 3 (three) times daily. 09/24/20  Yes Jw Covin, Canary Brim, MD  predniSONE (DELTASONE) 20 MG tablet Take 3 tablets (60 mg total) by mouth daily with breakfast. 09/24/20  Yes Paisleigh Maroney, Canary Brim, MD  valACYclovir (VALTREX) 1000 MG tablet Take 1 tablet (1,000 mg total) by mouth 3 (three) times  daily. 09/24/20  Yes Gitty Osterlund, Canary Brim, MD  ALPRAZolam Prudy Feeler) 0.5 MG tablet Take 1 tablet (0.5 mg total) by mouth 2 (two) times daily as needed for anxiety. 07/05/20   Cherie Ouch, PA-C  cholecalciferol (VITAMIN D3) 25 MCG (1000 UNIT) tablet Take 1,000 Units by mouth daily.    [provider]  desvenlafaxine (PRISTIQ) 100 MG 24 hr tablet TAKE 1 TABLET(100 MG) BY MOUTH DAILY 09/15/20   Melony Overly T, PA-C  l-methylfolate-B6-B12 [facility]) 3-35-2 MG TABS tablet TAKE 1 TABLET BY MOUTH DAILY 08/10/20   Melony Overly T, PA-C  Multiple Vitamin (MULTIVITAMIN) tablet Take 1 tablet by mouth daily.    [provider]  traZODone (DESYREL) 50 MG tablet Take 1 tablet (50 mg total) by mouth at bedtime as needed for sleep. 07/05/20   Cherie Ouch, PA-C    Allergies    Patient has no known allergies.  Review of Systems   Review of Systems  HENT: Positive for congestion, ear pain and sinus pain.   Neurological: Positive for weakness (Right facial).  All other systems reviewed and are negative.   Physical Exam Updated Vital Signs BP 112/76 (BP Location: Right Arm)   Pulse 84   Temp 98.6 F (37 C) (Oral)   Resp 16   Ht 5\' 1"  (1.549 m)   Wt  114.8 kg   SpO2 99%   BMI 47.80 kg/m   Physical Exam Vitals and nursing note reviewed.  Constitutional:      General: She is not in acute distress.    Appearance: Normal appearance. She is well-developed and well-nourished.  HENT:     Head: Normocephalic and atraumatic.     Right Ear: Hearing normal.     Left Ear: Hearing normal.     Nose: Nose normal.     Mouth/Throat:     Mouth: Oropharynx is clear and moist and mucous membranes are normal.  Eyes:     Extraocular Movements: EOM normal.     Conjunctiva/sclera: Conjunctivae normal.     Pupils: Pupils are equal, round, and reactive to light.  Cardiovascular:     Rate and Rhythm: Regular rhythm.     Heart sounds: S1 normal and S2 normal. No murmur heard. No friction rub. No  gallop.   Pulmonary:     Effort: Pulmonary effort is normal. No respiratory distress.     Breath sounds: Normal breath sounds.  Chest:     Chest wall: No tenderness.  Abdominal:     General: Bowel sounds are normal.     Palpations: Abdomen is soft. There is no hepatosplenomegaly.     Tenderness: There is no abdominal tenderness. There is no guarding or rebound. Negative signs include Murphy's sign and McBurney's sign.     Hernia: No hernia is present.  Musculoskeletal:        General: Normal range of motion.     Cervical back: Normal range of motion and neck supple.  Skin:    General: Skin is warm, dry and intact.     Findings: No rash.     Nails: There is no cyanosis.  Neurological:     Mental Status: She is alert and oriented to person, place, and time.     GCS: GCS eye subscore is 4. GCS verbal subscore is 5. GCS motor subscore is 6.     Cranial Nerves: Cranial nerve deficit (Right facial nerve palsy) present.     Sensory: No sensory deficit.     Coordination: Coordination normal.     Deep Tendon Reflexes: Strength normal.     Comments: Patient with drooping of the right side of her face, lagging eyelid with blinking and inability to raise right eyebrow  Psychiatric:        Mood and Affect: Mood and affect normal.        Speech: Speech normal.        Behavior: Behavior normal.        Thought Content: Thought content normal.     ED Results / Procedures / Treatments   Labs (all labs ordered are listed, but only abnormal results are displayed) Labs Reviewed  CBC - Abnormal; Notable for the following components:      Result Value   WBC 11.1 (*)    Platelets 438 (*)    All other components within normal limits  COMPREHENSIVE METABOLIC PANEL - Abnormal; Notable for the following components:   Glucose, Bld 130 (*)    All other components within normal limits  I-STAT CHEM 8, ED - Abnormal; Notable for the following components:   Glucose, Bld 127 (*)    All other components  within normal limits  PROTIME-INR  APTT  DIFFERENTIAL  I-STAT BETA HCG BLOOD, ED ([facility], [facility], [facility] ONLY)  CBG MONITORING, ED    EKG None  Radiology CT HEAD WO CONTRAST  Result Date: 09/23/2020 CLINICAL DATA:  Neuro deficit, acute, stroke suspected Patient reports facial droop for 2 days, progressive. Cannot move right side of mouth and right eyebrow. Patient reports right neck swelling and right ear pain for 1 week. EXAM: CT HEAD WITHOUT CONTRAST TECHNIQUE: Contiguous axial images were obtained from the base of the skull through the vertex without intravenous contrast. COMPARISON:  None. FINDINGS: Brain: No intracranial hemorrhage, mass effect, or midline shift. No hydrocephalus. The basilar cisterns are patent. No evidence of territorial infarct or acute ischemia. No extra-axial or intracranial fluid collection. Vascular: No hyperdense vessel or unexpected calcification. Skull: No fracture or focal lesion. Sinuses/Orbits: Mucosal thickening throughout the right greater than left sphenoid sinus with minimal frothy debris. Mild mucosal thickening of right greater than left maxillary sinus. No mastoid effusion or opacification of mastoid air cells. Included orbits are unremarkable. Other: None. IMPRESSION: 1. No acute intracranial abnormality. 2. Sphenoid sinus mucosal thickening with minimal frothy debris, suggesting acute sinusitis. Electronically Signed   By: Narda Rutherford M.D.   On: 09/23/2020 18:49    Procedures Procedures (including critical care time)  Medications Ordered in ED Medications  sodium chloride flush (NS) 0.9 % injection 3 mL (has no administration in time range)  valACYclovir (VALTREX) tablet 1,000 mg (has no administration in time range)  predniSONE (DELTASONE) tablet 60 mg (has no administration in time range)    ED Course  I have reviewed the triage vital signs and the nursing notes.  Pertinent labs & imaging results that were available during my care of the patient  were reviewed by me and considered in my medical decision making (see chart for details).    MDM Rules/Calculators/A&P                          Patient presents with right facial nerve palsy.  Paralysis includes upper motor neuron, presentation consistent with Bell's palsy.  Patient does report right ear pain.  She has a small scab on the external portion of her ear, but no vesicles.  Based on this, we will add acyclovir to typical prednisone treatment for Bell's palsy.  Additionally, head CT shows acute sinusitis, add amoxicillin.  Final Clinical Impression(s) / ED Diagnoses Final diagnoses:  Bell's palsy  Acute ethmoidal sinusitis, recurrence not specified    Rx / DC Orders ED Discharge Orders         Ordered    amoxicillin (AMOXIL) 500 MG capsule  3 times daily        09/24/20 0203    valACYclovir (VALTREX) 1000 MG tablet  3 times daily        09/24/20 0203    predniSONE (DELTASONE) 20 MG tablet  Daily with breakfast        09/24/20 0203           Gilda Crease, MD 09/24/20 [phone]

## 2020-09-24 NOTE — ED Notes (Signed)
Patient verbalizes understanding of discharge instructions. Opportunity for questioning and answers were provided. Armband removed by staff, pt discharged from ED ambulatory.   

## 2020-09-27 ENCOUNTER — Ambulatory Visit: Payer: 59 | Admitting: Psychiatry

## 2020-09-29 ENCOUNTER — Ambulatory Visit (INDEPENDENT_AMBULATORY_CARE_PROVIDER_SITE_OTHER): Payer: 59 | Admitting: Physician Assistant

## 2020-09-29 ENCOUNTER — Encounter: Payer: Self-pay | Admitting: Physician Assistant

## 2020-09-29 ENCOUNTER — Other Ambulatory Visit: Payer: Self-pay

## 2020-09-29 DIAGNOSIS — Z1589 Genetic susceptibility to other disease: Secondary | ICD-10-CM

## 2020-09-29 DIAGNOSIS — F3341 Major depressive disorder, recurrent, in partial remission: Secondary | ICD-10-CM

## 2020-09-29 DIAGNOSIS — F411 Generalized anxiety disorder: Secondary | ICD-10-CM | POA: Diagnosis not present

## 2020-09-29 DIAGNOSIS — F4310 Post-traumatic stress disorder, unspecified: Secondary | ICD-10-CM | POA: Diagnosis not present

## 2020-09-29 MED ORDER — DESVENLAFAXINE SUCCINATE ER 100 MG PO TB24: ORAL_TABLET | ORAL | 1 refills | Status: DC

## 2020-09-29 MED ORDER — L-METHYLFOLATE-B6-B12 3-35-2 MG PO TABS: 1.0000 | ORAL_TABLET | Freq: Every day | ORAL | 1 refills | Status: DC

## 2020-09-29 MED ORDER — ALPRAZOLAM 0.5 MG PO TABS: 0.5000 mg | ORAL_TABLET | Freq: Two times a day (BID) | ORAL | 3 refills | Status: DC | PRN

## 2020-09-29 NOTE — Progress Notes (Signed)
[facility] Med Check  Patient ID: Stephanie Bryan,  MRN: [id number]  PCP: Patient, No Pcp Per  Date of Evaluation: 09/29/2020 Time spent:20 minutes   HISTORY/CURRENT STATUS: HPI for routine med check.  Doing quite a bit better. Feels like her meds are working well mood-wise. Is able to enjoy things, energy and motivation are better, not isolating, not crying easily, not sleeping as well since being on steroids for Bells Palsy, no SI/HI.   Anxiety is still there sometimes and she doesn't take the xanax often, but she likes to have it on hand just to know it is there.  Patient denies increased energy with decreased need for sleep, no increased talkativeness, no racing thoughts, no impulsivity or risky behaviors, no increased spending, no increased libido, no grandiosity, no increased irritability or anger, and no hallucinations.  Denies dizziness, syncope, seizures, numbness, tingling, tremor, tics, unsteady gait, slurred speech, confusion. Denies muscle or joint pain, stiffness, or dystonia.  Individual Medical History/ Review of Systems: Changes? Has Bells Palsy  Past medications for mental health diagnoses include: Wellbutrin, Prozac, Lithium, Seroquel, Trazodone, Klonopin, Xanax, Geodon, Risperdal, Saphris, Haldol, Trintellix, Hydroxyzine, Effexor, Pristiq  Allergies: Patient has no known allergies.  Current Medications:  Current Outpatient Medications:  .  amoxicillin (AMOXIL) 500 MG capsule, Take 1 capsule (500 mg total) by mouth 3 (three) times daily., Disp: 30 capsule, Rfl: 0 .  cholecalciferol (VITAMIN D3) 25 MCG (1000 UNIT) tablet, Take 1,000 Units by mouth daily., Disp: , Rfl:  .  Multiple Vitamin (MULTIVITAMIN) tablet, Take 1 tablet by mouth daily., Disp: , Rfl:  .  predniSONE (DELTASONE) 20 MG tablet, Take 3 tablets (60 mg total) by mouth daily with breakfast., Disp: 18 tablet, Rfl: 0 .  traZODone (DESYREL) 50 MG tablet, Take 1 tablet (50 mg total) by mouth at  bedtime as needed for sleep., Disp: 90 tablet, Rfl: 1 .  valACYclovir (VALTREX) 1000 MG tablet, Take 1 tablet (1,000 mg total) by mouth 3 (three) times daily., Disp: 21 tablet, Rfl: 0 .  ALPRAZolam (XANAX) 0.5 MG tablet, Take 1 tablet (0.5 mg total) by mouth 2 (two) times daily as needed for anxiety., Disp: 30 tablet, Rfl: 3 .  desvenlafaxine (PRISTIQ) 100 MG 24 hr tablet, TAKE 1 TABLET(100 MG) BY MOUTH DAILY, Disp: 90 tablet, Rfl: 1 .  l-methylfolate-B6-B12 (METANX) 3-35-2 MG TABS tablet, Take 1 tablet by mouth daily., Disp: 90 tablet, Rfl: 1 Medication Side Effects: None  Family Medical/ Social History: Changes? No  MENTAL HEALTH EXAM:  There were no vitals taken for this visit.There is no height or weight on file to calculate BMI.  General Appearance: Casual, Neat, Well Groomed and obese   Eye Contact:  Good  Speech:  Clear and Coherent  Volume:  Normal  Mood:  Euthymic  Affect:  Congruent  Thought Process:  Goal Directed  Orientation:  Full (Time, Place, and Person)  Thought Content: Logical   Suicidal Thoughts:  No  Homicidal Thoughts:  No  Memory:  Immediate normal  Judgement:  Good  Insight:  Good  Psychomotor Activity:  Normal  Concentration:  Concentration: Good and Attention Span: Good  Recall:  Good  Fund of Knowledge: Good  Language: Good  Akathisia:  No  AIMS (if indicated): not done  Assets:  Desire for Improvement  ADL's:  Intact  Cognition: WNL  Prognosis:  Good   Gene site test results from 2019 were reviewed and discussed with patient.  MTHFR was abnormal.  DIAGNOSES:  ICD-10-CM   1. Recurrent major depressive disorder, in partial remission (HCC)  F33.41   2. MTHFR gene mutation  Z15.89   3. Generalized anxiety disorder  F41.1   4. PTSD (post-traumatic stress disorder)  F43.10       Plan: PDMP reviewed.  I provided 20 mins of face to face time during this encounter in which we discussed her response to medications.  I am glad to see her doing  better! Continue Pristiq 100 mg qd. Continue Xanax 0.5 mg, 1 p.o. twice daily as needed. Continue Trazodone 50 mg 1 qhs prn sleep.  Continue Metanx 1 p.o. daily. Continue psychotherapy with Sherron Monday, [facility] Return in 3 months.  Melony Overly, PA-C

## 2020-10-11 ENCOUNTER — Encounter: Payer: Self-pay | Admitting: Psychiatry

## 2020-10-11 ENCOUNTER — Other Ambulatory Visit: Payer: Self-pay

## 2020-10-11 ENCOUNTER — Ambulatory Visit (INDEPENDENT_AMBULATORY_CARE_PROVIDER_SITE_OTHER): Payer: 59 | Admitting: Psychiatry

## 2020-10-11 DIAGNOSIS — F3341 Major depressive disorder, recurrent, in partial remission: Secondary | ICD-10-CM | POA: Diagnosis not present

## 2020-10-11 NOTE — Progress Notes (Signed)
[facility] Counselor/Therapist Progress Note  Patient ID: Stephanie Bryan, MRN: [id number],    Date: 10/11/2020  Time Spent: 50 minutes   Treatment Type: Individual Therapy  Reported Symptoms: anxiety, sad  Mental Status Exam:  Appearance:   Casual     Behavior:  Appropriate  Motor:  Normal  Speech/Language:   Clear and Coherent  Affect:  Tearful  Mood:  anxious and sad  Thought process:  normal  Thought content:    WNL  Sensory/Perceptual disturbances:    WNL  Orientation:  oriented to person, place, time/date and situation  Attention:  Good  Concentration:  Good  Memory:  WNL  Fund of knowledge:   Good  Insight:    Good  Judgment:   Good  Impulse Control:  Good   Risk Assessment: Danger to Self:  No Self-injurious Behavior: No Danger to Others: No Duty to Warn:no Physical Aggression / Violence:No  Access to Firearms a concern: No  Gang Involvement:No   Subjective: The client states that overall her mood has improved.  She is still seeing a man in [facility] romantically.  It seems to be working out well for her.  She states that this past January though has been rough for her.  Early in January the client developed a serious sinus infection.  From that she developed Bell's palsy.  She presented at the ED thinking she was having a stroke.  She was treated with steroids and antibiotics.  The Bell's palsy has completely resolved itself.  Soon after that the client's ex boyfriend's father died from colon cancer.  The client states that she went to the funeral.  As she discussed it today she became very, very tearful.  She discussed that her ex-boyfriend's dad was a, "real dad" to her.  She described him fondly.  I suggested to the client that as part of her grief process she could start a small journal and write down stories she remembers about this man.  The client felt like this would be a good idea.  We also discussed if there was a particular plant that she would  like to have that reminds her of him?  Her initial thought was a cast iron plant.  She will look into this more. With the improvement of her mood and energy she has been doing more projects around her house.  She is cleaning things up more and organizing things.  She is also starting to work on her outside garden.  She states this is more energy than she has had in a long, long time.  She is focused on acquiring her driver's license and ultimately purchasing a car. During the course of the session I used eye-movement and bilateral stimulation and paddles with the client.  We were able to reduce her sadness from a subjective units of distress of 7 to less than 2.  She does not feel like her grief is overwhelming her.  She will try journaling and other ways of memorializing her ex-boyfriend's dad.  Interventions: Assertiveness/Communication, Motivational Interviewing, Solution-Oriented/Positive Psychology, [employer] Desensitization and Reprocessing (EMDR) and Insight-Oriented  Diagnosis:   ICD-10-CM   1. Recurrent major depressive disorder, in partial remission (HCC)  F33.41     Plan: Mood independent behavior, positive self talk, self-care, journaling, plant something and honor of her ex-boyfriend's dad, home projects, exercise, acquire driver's license.  Gelene Mink Shacoya Burkhammer, [facility]

## 2020-10-22 ENCOUNTER — Other Ambulatory Visit: Payer: Self-pay | Admitting: Physician Assistant

## 2020-11-15 ENCOUNTER — Other Ambulatory Visit: Payer: Self-pay | Admitting: Physician Assistant

## 2020-11-30 ENCOUNTER — Ambulatory Visit: Payer: 59 | Admitting: Neurology

## 2020-12-20 ENCOUNTER — Ambulatory Visit (INDEPENDENT_AMBULATORY_CARE_PROVIDER_SITE_OTHER): Payer: 59 | Admitting: Psychiatry

## 2020-12-20 ENCOUNTER — Other Ambulatory Visit: Payer: Self-pay

## 2020-12-20 ENCOUNTER — Encounter: Payer: Self-pay | Admitting: Psychiatry

## 2020-12-20 DIAGNOSIS — F3341 Major depressive disorder, recurrent, in partial remission: Secondary | ICD-10-CM
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
## 2020-12-27 ENCOUNTER — Encounter: Payer: Self-pay | Admitting: Physician Assistant

## 2020-12-27 ENCOUNTER — Ambulatory Visit (INDEPENDENT_AMBULATORY_CARE_PROVIDER_SITE_OTHER): Payer: 59 | Admitting: Physician Assistant

## 2020-12-27 ENCOUNTER — Other Ambulatory Visit: Payer: Self-pay

## 2020-12-27 DIAGNOSIS — Z1589 Genetic susceptibility to other disease: Secondary | ICD-10-CM

## 2020-12-27 DIAGNOSIS — F411 Generalized anxiety disorder: Secondary | ICD-10-CM

## 2020-12-27 DIAGNOSIS — F4310 Post-traumatic stress disorder, unspecified: Secondary | ICD-10-CM

## 2020-12-27 DIAGNOSIS — F3341 Major depressive disorder, recurrent, in partial remission: Secondary | ICD-10-CM

## 2020-12-27 MED ORDER — TRAZODONE HCL 50 MG PO TABS: 50.0000 mg | ORAL_TABLET | Freq: Every evening | ORAL | 1 refills | Status: DC | PRN

## 2020-12-27 NOTE — Progress Notes (Signed)
[facility] Med Check  Patient ID: Stephanie Bryan,  MRN: [id number]  PCP: Patient, No Pcp Per (Inactive)  Date of Evaluation: 12/27/2020 Time spent:20 minutes  Chief Complaint:  Chief Complaint    Anxiety; Depression; Insomnia      HISTORY/CURRENT STATUS: HPI For routine med check.   Has been stressed more the past week with work. But things are ok.  Doing well even under the pressure.  "If this had been a year or 2 ago I would have crumbled under the stress.  I am glad I know how to cope with things better."    Is able to enjoy things, like gardening. Energy and motivation are ok. Sleeps good with the Trazodone, but doesn't always remember to take it though.  Does not cry easily.  Appetite is normal.  Weight stable.  Denies suicidal or homicidal thoughts.  Does still have anxiety but tries hard not to take the Xanax.  It is still effective when needed.  Patient denies increased energy with decreased need for sleep, no increased talkativeness, no racing thoughts, no impulsivity or risky behaviors, no increased spending, no increased libido, no grandiosity, no increased irritability or anger, and no hallucinations.  Denies dizziness, syncope, seizures, numbness, tingling, tremor, tics, unsteady gait, slurred speech, confusion. Denies muscle or joint pain, stiffness, or dystonia.  Individual Medical History/ Review of Systems: Changes? :No   Past medications for mental health diagnoses include: Wellbutrin, Prozac, Lithium, Seroquel, Trazodone, Klonopin, Xanax, Geodon, Risperdal, Saphris, Haldol, Trintellix, Hydroxyzine, Effexor, Pristiq  Allergies: Patient has no known allergies.  Current Medications:  Current Outpatient Medications:  .  Acetylcysteine, Nutrient, (N-ACETYL CYSTEINE) 600 MG TABS, See admin instructions., Disp: , Rfl:  .  ALPRAZolam (XANAX) 0.5 MG tablet, Take 1 tablet (0.5 mg total) by mouth 2 (two) times daily as needed for anxiety., Disp: 30 tablet,  Rfl: 3 .  cholecalciferol (VITAMIN D3) 25 MCG (1000 UNIT) tablet, Take 1,000 Units by mouth daily., Disp: , Rfl:  .  desvenlafaxine (PRISTIQ) 100 MG 24 hr tablet, TAKE 1 TABLET(100 MG) BY MOUTH DAILY, Disp: 30 tablet, Rfl: 5 .  l-methylfolate-B6-B12 (METANX) 3-35-2 MG TABS tablet, TAKE 1 TABLET BY MOUTH DAILY, Disp: 30 tablet, Rfl: 5 .  Multiple Vitamin (MULTIVITAMIN) tablet, Take 1 tablet by mouth daily., Disp: , Rfl:  .  Saccharomyces boulardii (PROBIOTIC) 250 MG CAPS, 1 capsule, Disp: , Rfl:  .  amoxicillin (AMOXIL) 500 MG capsule, Take 1 capsule (500 mg total) by mouth 3 (three) times daily., Disp: 30 capsule, Rfl: 0 .  predniSONE (DELTASONE) 20 MG tablet, Take 3 tablets (60 mg total) by mouth daily with breakfast., Disp: 18 tablet, Rfl: 0 .  traZODone (DESYREL) 50 MG tablet, Take 1 tablet (50 mg total) by mouth at bedtime as needed for sleep., Disp: 90 tablet, Rfl: 1 .  valACYclovir (VALTREX) 1000 MG tablet, Take 1 tablet (1,000 mg total) by mouth 3 (three) times daily., Disp: 21 tablet, Rfl: 0 Medication Side Effects: none  Family Medical/ Social History: Changes? No  MENTAL HEALTH EXAM:  There were no vitals taken for this visit.There is no height or weight on file to calculate BMI.  General Appearance: Casual, Neat, Well Groomed and Obese  Eye Contact:  Good  Speech:  Clear and Coherent and Normal Rate  Volume:  Normal  Mood:  Euthymic  Affect:  Appropriate  Thought Process:  Goal Directed and Descriptions of Associations: Intact  Orientation:  Full (Time, Place, and Person)  Thought Content: Logical  Suicidal Thoughts:  No  Homicidal Thoughts:  No  Memory:  WNL  Judgement:  Good  Insight:  Good  Psychomotor Activity:  Normal  Concentration:  Concentration: Good  Recall:  Good  Fund of Knowledge: Good  Language: Good  Assets:  Desire for Improvement  ADL's:  Intact  Cognition: WNL  Prognosis:  Good    DIAGNOSES:    ICD-10-CM   1. PTSD (post-traumatic stress  disorder)  F43.10   2. Recurrent major depressive disorder, in partial remission (HCC)  F33.41   3. MTHFR gene mutation  Z15.89   4. Generalized anxiety disorder  F41.1     Receiving Psychotherapy: Yes  yes with Sherron Monday, [facility].  He is retiring soon and she will be seeing another provider.  RECOMMENDATIONS:  PDMP was reviewed. I provided 20 minutes of face-to-face time during this encounter including time spent before and after the visit and records review and charting. She is doing really well so no changes need to be made. Continue Xanax 0.5 mg, 1 p.o. twice daily as needed. Continue Pristiq 100 mg, 1 p.o. daily. Continue trazodone 50 mg, 1 p.o. nightly as needed sleep. Continue L-methylfolate. Return in 6 months.  Melony Overly, PA-C

## 2021-05-20 ENCOUNTER — Other Ambulatory Visit: Payer: Self-pay | Admitting: Physician Assistant

## 2021-05-23 NOTE — Telephone Encounter (Signed)
Last filled 5/25

## 2021-05-24 NOTE — Telephone Encounter (Signed)
Pt called checking the status of this refill. Next appt 10/18

## 2021-06-22 ENCOUNTER — Other Ambulatory Visit: Payer: Self-pay | Admitting: Physician Assistant

## 2021-06-24 NOTE — Telephone Encounter (Signed)
Patient called in for refill on Pristiq 100mg . Appt 10/18. Ph:[phone]. Pharmacy [facility] [address] [place]

## 2021-06-27 ENCOUNTER — Telehealth: Payer: Self-pay | Admitting: Physician Assistant

## 2021-06-27 NOTE — Telephone Encounter (Signed)
Pt called to say she has been unable to get her RX for Pristiq at the pharmacy. Pharmacy told her that they didn't have it on file. She has been out over the weekend and is going thru w/d. I called [facility] and held for 15 mins twice and then called back and left a voice mail. I let them know we electronically sent the RX on 10/14 and pt needs it filled. Called pt back and advised her to go up there to [facility] to see what the issue is and let them know we left a voice mail. Anything to do differently?

## 2021-06-27 NOTE — Telephone Encounter (Signed)
No, thank you for doing all that.

## 2021-06-28 ENCOUNTER — Encounter: Payer: Self-pay | Admitting: Physician Assistant

## 2021-06-28 ENCOUNTER — Ambulatory Visit (INDEPENDENT_AMBULATORY_CARE_PROVIDER_SITE_OTHER): Payer: 59 | Admitting: Physician Assistant

## 2021-06-28 ENCOUNTER — Other Ambulatory Visit: Payer: Self-pay

## 2021-06-28 DIAGNOSIS — F411 Generalized anxiety disorder: Secondary | ICD-10-CM

## 2021-06-28 DIAGNOSIS — Z1589 Genetic susceptibility to other disease: Secondary | ICD-10-CM

## 2021-06-28 DIAGNOSIS — F3341 Major depressive disorder, recurrent, in partial remission: Secondary | ICD-10-CM

## 2021-06-28 DIAGNOSIS — G4700 Insomnia, unspecified: Secondary | ICD-10-CM

## 2021-06-28 DIAGNOSIS — F4310 Post-traumatic stress disorder, unspecified: Secondary | ICD-10-CM

## 2021-06-28 MED ORDER — DESVENLAFAXINE SUCCINATE ER 100 MG PO TB24: 100.0000 mg | ORAL_TABLET | Freq: Every day | ORAL | 1 refills | Status: DC

## 2021-06-28 MED ORDER — CEREFOLIN NAC 6-2-600 MG PO TABS: 1.0000 | ORAL_TABLET | Freq: Every day | ORAL | 3 refills | Status: AC

## 2021-06-28 MED ORDER — TRAZODONE HCL 50 MG PO TABS: 50.0000 mg | ORAL_TABLET | Freq: Every evening | ORAL | 1 refills | Status: DC | PRN

## 2021-06-28 MED ORDER — ALPRAZOLAM 0.5 MG PO TABS: ORAL_TABLET | ORAL | 5 refills | Status: DC

## 2021-06-28 NOTE — Progress Notes (Signed)
[facility] Med Check  Patient ID: Stephanie Bryan,  MRN: [id number]  PCP: Patient, No Pcp Per (Inactive)  Date of Evaluation: 06/28/2021 Time spent:30 minutes  Chief Complaint:  Chief Complaint   Anxiety; Depression; Insomnia; Follow-up      HISTORY/CURRENT STATUS: HPI For routine med check.   Has been out of Pristiq 3 days. For some reason pharmacy has not given it to her even though it was sent in last week. Is having brain zaps. Feels that meds are working well, until this issue. Also hasn't been on Lmethylfolate b/c pharmacy is out.   Patient denies loss of interest in usual activities and is able to enjoy things.  She does have decreased energy since she has been off the L-methylfolate.  Appetite has not changed.  No extreme sadness, tearfulness, or feelings of hopelessness.  Her ex partner of many years died suddenly and that has made her sad.  Denies any changes in concentration, making decisions or remembering things.  She sleeps well most of the time.  Work is going fine.  Denies suicidal or homicidal thoughts.  She still has anxiety at times.  Not usually panic attacks but more of a nervousness, like something bad is going to happen.  The Xanax is still helpful.  Patient denies increased energy with decreased need for sleep, no increased talkativeness, no racing thoughts, no impulsivity or risky behaviors, no increased spending, no increased libido, no grandiosity, no increased irritability or anger, and no hallucinations.  Denies dizziness, syncope, seizures, numbness, tingling, tremor, tics, unsteady gait, slurred speech, confusion. Denies muscle or joint pain, stiffness, or dystonia.  Individual Medical History/ Review of Systems: Changes? :No   Past medications for mental health diagnoses include: Wellbutrin, Prozac, Lithium, Seroquel, Trazodone, Klonopin, Xanax, Geodon, Risperdal, Saphris, Haldol, Trintellix, Hydroxyzine, Effexor, Pristiq  Allergies:  Patient has no known allergies.  Current Medications:  Current Outpatient Medications:    cholecalciferol (VITAMIN D3) 25 MCG (1000 UNIT) tablet, Take 1,000 Units by mouth daily., Disp: , Rfl:    Methylfol-Methylcob-Acetylcyst (CEREFOLIN NAC) 6-2-600 MG TABS, Take 1 capsule by mouth daily., Disp: 90 tablet, Rfl: 3   Multiple Vitamin (MULTIVITAMIN) tablet, Take 1 tablet by mouth daily., Disp: , Rfl:    Saccharomyces boulardii (PROBIOTIC) 250 MG CAPS, 1 capsule, Disp: , Rfl:    ALPRAZolam (XANAX) 0.5 MG tablet, TAKE 1 TABLET(0.5 MG) BY MOUTH TWICE DAILY AS NEEDED FOR ANXIETY, Disp: 30 tablet, Rfl: 5   desvenlafaxine (PRISTIQ) 100 MG 24 hr tablet, Take 1 tablet (100 mg total) by mouth daily., Disp: 90 tablet, Rfl: 1   traZODone (DESYREL) 50 MG tablet, Take 1 tablet (50 mg total) by mouth at bedtime as needed for sleep., Disp: 90 tablet, Rfl: 1   valACYclovir (VALTREX) 1000 MG tablet, Take 1 tablet (1,000 mg total) by mouth 3 (three) times daily., Disp: 21 tablet, Rfl: 0 Medication Side Effects: none  Family Medical/ Social History: Changes?  Her previous partner died suddenly since LOV. Has been hard.  Has started back dancing and is enjoying that very much.  MENTAL HEALTH EXAM:  There were no vitals taken for this visit.There is no height or weight on file to calculate BMI.  General Appearance: Casual, Neat, Well Groomed and Obese  Eye Contact:  Good  Speech:  Clear and Coherent and Normal Rate  Volume:  Normal  Mood:  Euthymic  Affect:   Tearful when telling me about the death of her ex boyfriend but otherwise affect is normal.  Thought  Process:  Goal Directed and Descriptions of Associations: Intact  Orientation:  Full (Time, Place, and Person)  Thought Content: Logical   Suicidal Thoughts:  No  Homicidal Thoughts:  No  Memory:  WNL  Judgement:  Good  Insight:  Good  Psychomotor Activity:  Normal  Concentration:  Concentration: Good  Recall:  Good  Fund of Knowledge: Good   Language: Good  Assets:  Desire for Improvement  ADL's:  Intact  Cognition: WNL  Prognosis:  Good    DIAGNOSES:    ICD-10-CM   1. Recurrent major depressive disorder, in partial remission (HCC)  F33.41     2. PTSD (post-traumatic stress disorder)  F43.10     3. Generalized anxiety disorder  F41.1     4. MTHFR gene mutation  Z15.89     5. Insomnia, unspecified type  G47.00        Receiving Psychotherapy: Yes   Burney Gauze     RECOMMENDATIONS:  PDMP was reviewed.  Last Xanax filled 05/24/2021. I provided 30 minutes of face to face time during this encounter, including time spent before and after the visit in records review, medical decision making, counseling pertinent to today's visit, and charting.  Discussed Cerefolin NAC.  Because she has had trouble getting the L-methylfolate at the pharmacy we decide to go this route.  Plus she will not have to take NAC because it will already be in this formulation.  She knows to be on the look out for a phone call from a company called [employer], answer the call as they will be contacting her for address and payment method. If for any reason she is unable to get the Pristiq today, call as soon as she can and I will contact the pharmacy myself.  She should not have had to go through withdrawals for any reason. Start Cerefolin NAC daily. Continue Xanax 0.5 mg, 1 p.o. twice daily as needed. Continue Pristiq 100 mg, 1 p.o. daily. Continue trazodone 50 mg, 1 p.o. nightly as needed sleep. Return in 4 months.  Melony Overly, PA-C

## 2021-09-14 IMAGING — CT CT HEAD W/O CM
4 series · 16 of 47 positions shown, 18 images · non-contrast
Comparison: None.

CLINICAL DATA: Neuro deficit, acute, stroke suspected

Patient reports facial droop for 2 days, progressive. Cannot move
right side of mouth and right eyebrow. Patient reports right neck
swelling and right ear pain for 1 week.
EXAM:
CT HEAD WITHOUT CONTRAST
TECHNIQUE: Contiguous axial images were obtained from the base of the skull
through the vertex without intravenous contrast.

[Series 3: head wo · axial · 0.49mm/px · z∈[-80,+40]mm · 7 of 33 slices shown, 9 images]
[im 5/33  brain]
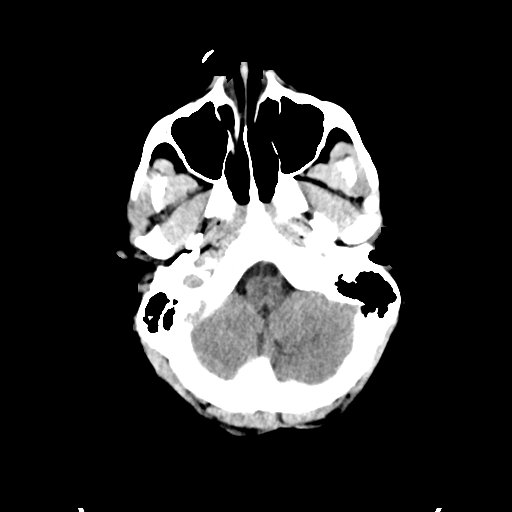
[im 5/33  bone]
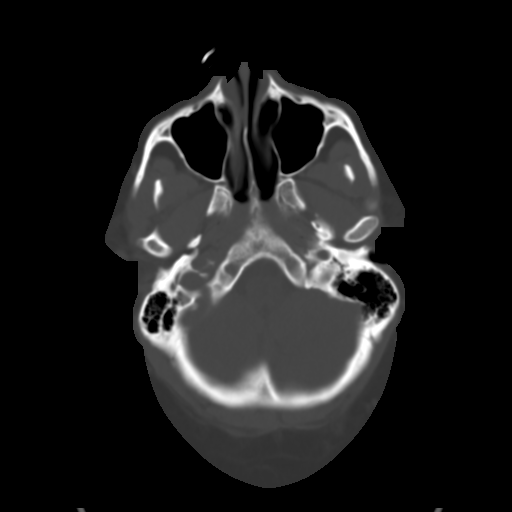
[im 9/33  brain]
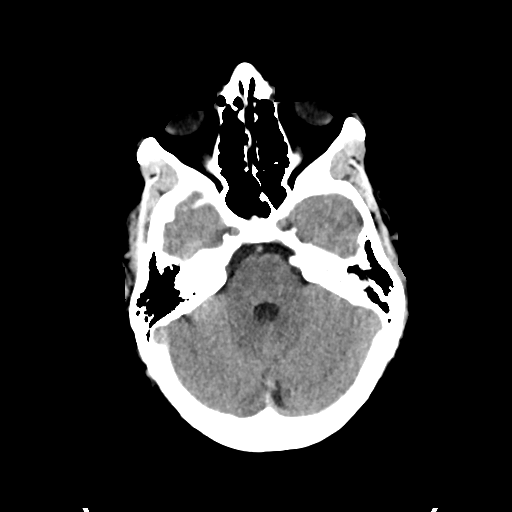
[im 13/33  brain]
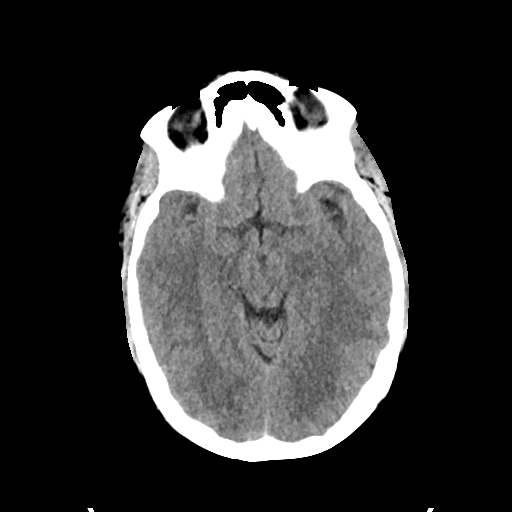
[im 17/33  brain]
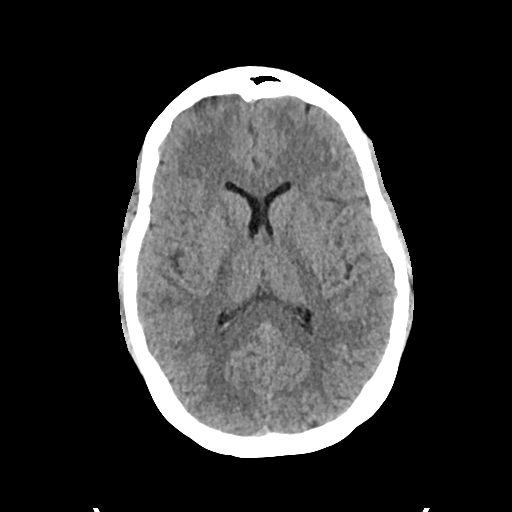
[im 21/33  brain]
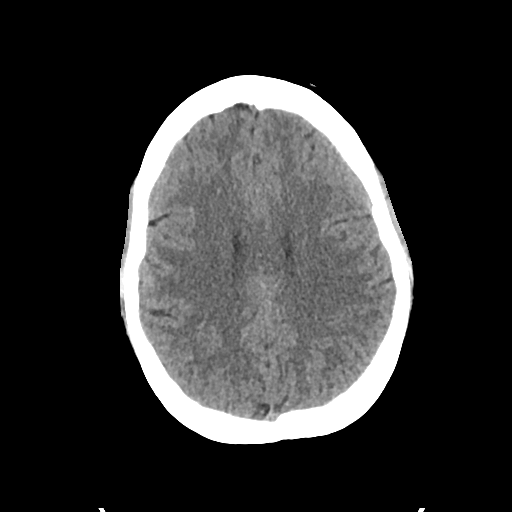
[im 21/33  bone]
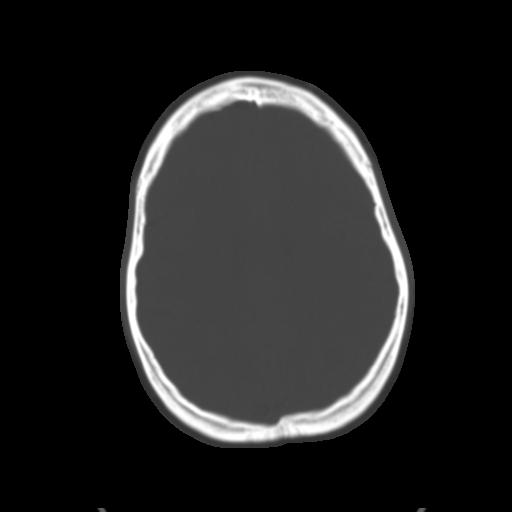
[im 25/33  brain]
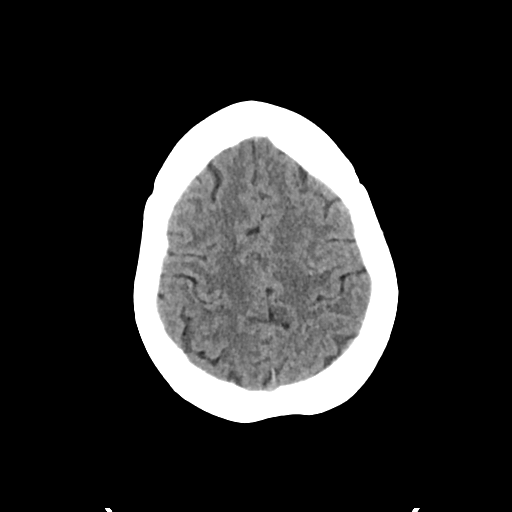
[im 29/33  brain]
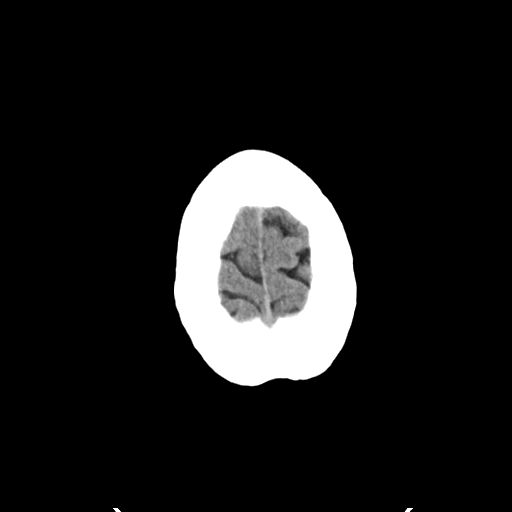

[Series 4: head bone · axial · 0.49mm/px · z∈[-84,-52]mm · 3 of 83 slices shown]
[im 9/83  bone]
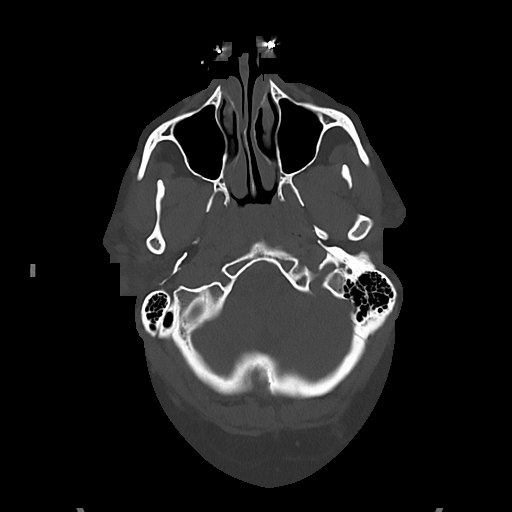
[im 17/83  bone]
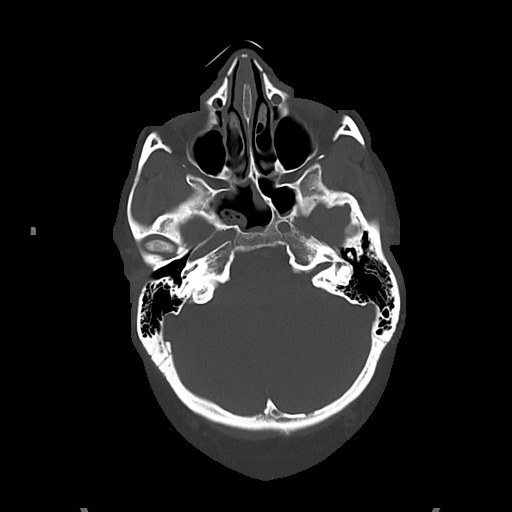
[im 25/83  bone]
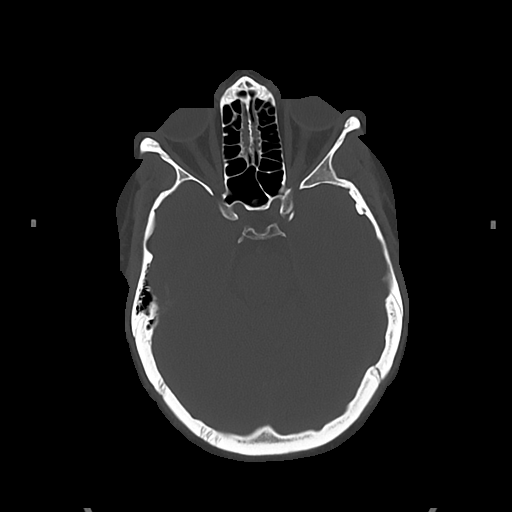

[Series 5: cor soft · coronal · 0.33mm/px · 3 of 77 slices shown]
[im 26/77  brain]
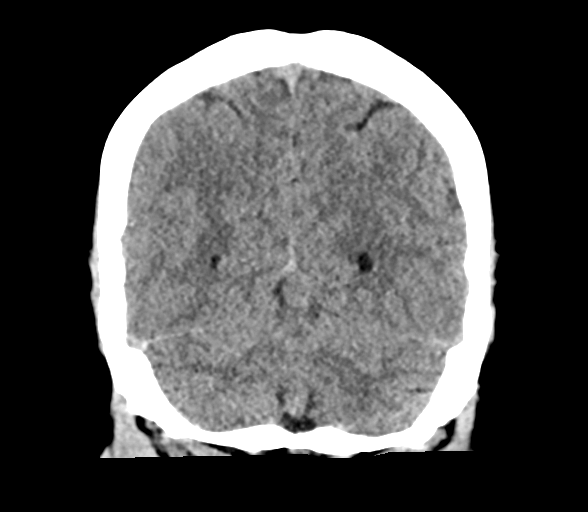
[im 34/77  brain]
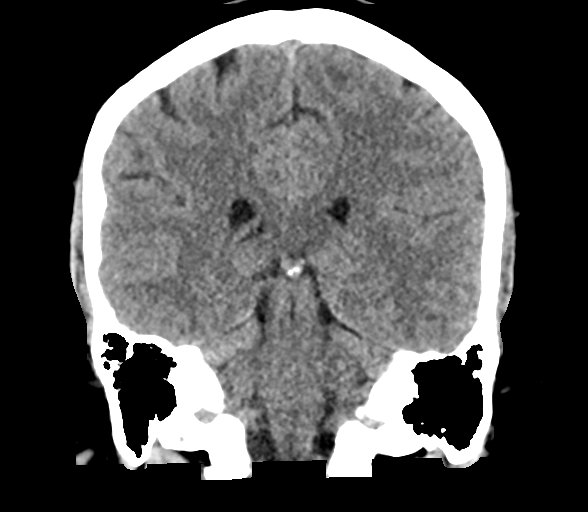
[im 43/77  brain]
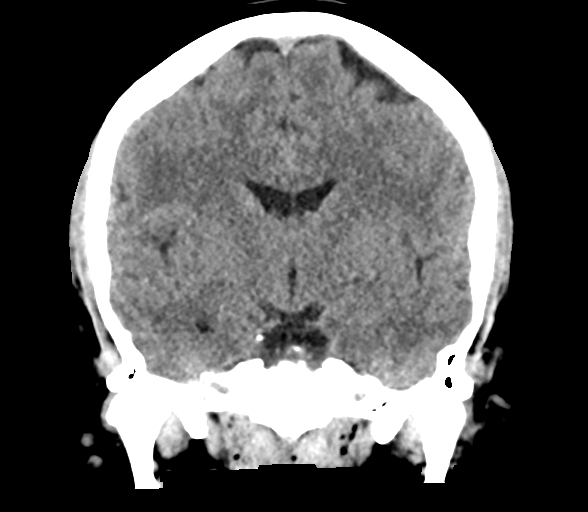

[Series 6: sag soft · sagittal · 0.33mm/px · 3 of 65 slices shown]
[im 22/65  brain]
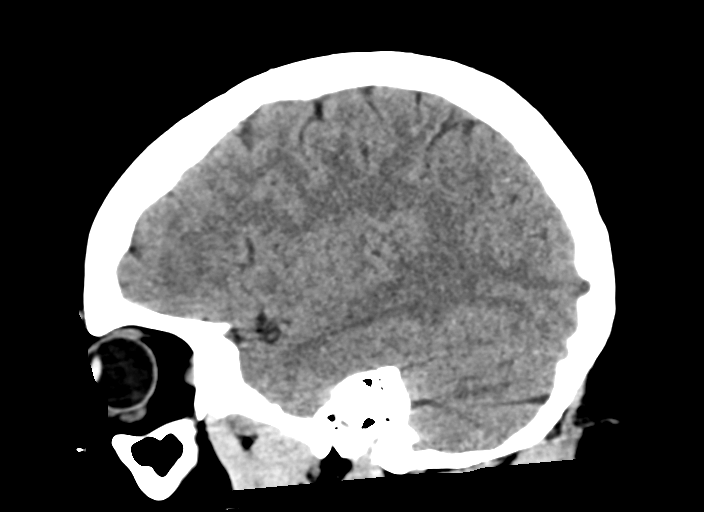
[im 33/65  brain]
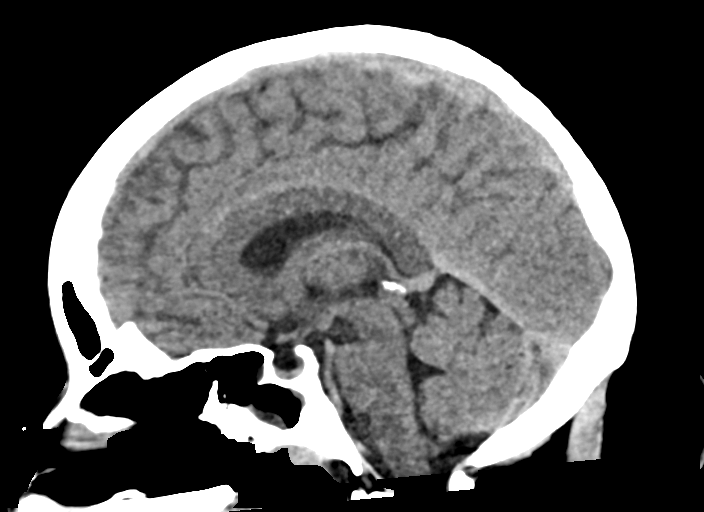
[im 43/65  brain]
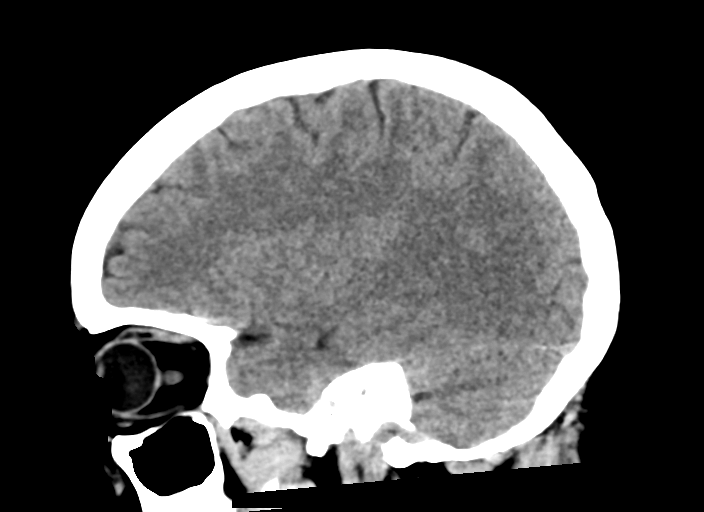

[16 of 47 positions shown; findings below may reference images not displayed]

FINDINGS: Brain: No intracranial hemorrhage, mass effect, or midline shift. No
hydrocephalus. The basilar cisterns are patent. No evidence of
territorial infarct or acute ischemia. No extra-axial or
intracranial fluid collection.

Vascular: No hyperdense vessel or unexpected calcification.

Skull: No fracture or focal lesion.

Sinuses/Orbits: Mucosal thickening throughout the right greater than
left sphenoid sinus with minimal frothy debris. Mild mucosal
thickening of right greater than left maxillary sinus. No mastoid
effusion or opacification of mastoid air cells. Included orbits are
unremarkable.

Other: None.
IMPRESSION: 1. No acute intracranial abnormality.
2. Sphenoid sinus mucosal thickening with minimal frothy debris,
suggesting acute sinusitis.

## 2021-10-31 ENCOUNTER — Encounter: Payer: Self-pay | Admitting: Physician Assistant

## 2021-10-31 ENCOUNTER — Other Ambulatory Visit: Payer: Self-pay

## 2021-10-31 ENCOUNTER — Ambulatory Visit (INDEPENDENT_AMBULATORY_CARE_PROVIDER_SITE_OTHER): Payer: 59 | Admitting: Physician Assistant

## 2021-10-31 DIAGNOSIS — F411 Generalized anxiety disorder: Secondary | ICD-10-CM

## 2021-10-31 DIAGNOSIS — F4310 Post-traumatic stress disorder, unspecified: Secondary | ICD-10-CM | POA: Diagnosis not present

## 2021-10-31 DIAGNOSIS — F3342 Major depressive disorder, recurrent, in full remission: Secondary | ICD-10-CM | POA: Diagnosis not present

## 2021-10-31 DIAGNOSIS — Z1589 Genetic susceptibility to other disease: Secondary | ICD-10-CM | POA: Diagnosis not present

## 2021-10-31 MED ORDER — DESVENLAFAXINE SUCCINATE ER 100 MG PO TB24: 100.0000 mg | ORAL_TABLET | Freq: Every day | ORAL | 1 refills | Status: DC

## 2021-10-31 MED ORDER — TRAZODONE HCL 50 MG PO TABS: 50.0000 mg | ORAL_TABLET | Freq: Every evening | ORAL | 1 refills | Status: DC | PRN

## 2021-10-31 NOTE — Progress Notes (Signed)
[facility]  Patient ID: Stephanie Bryan,  MRN: [id number]  PCP: Patient, No Pcp Per (Inactive)  Date of Evaluation: 10/31/2021 Time spent:20 minutes  Chief Complaint:  Chief Complaint   Anxiety; Depression; Insomnia; Follow-up     HISTORY/CURRENT STATUS: HPI For routine med check.   Doing really well.  Her meds are working great.  She is feeling good physically.  Has started taking ariel classes.  She is really enjoying that.  Energy and motivation are good.  ADLs and personal hygiene are normal.  Does not cry easily.  Work is going well, with his new job her hours are 6-1 so she is having to get used to waking up so early but it is going well.  No suicidal or homicidal thoughts.  She still has anxiety at times.  Not usually panic attacks but more of a nervousness, like something bad is going to happen.  The Xanax is still helpful, does not need it very often.  She is sleeping well.  Sometimes needs the trazodone and other times not.  It is effective when needed.  Has not been taking the Cerefolin NAC, only because of the job change and has not been able to afford it.  She will restart it as soon as she can.  In the meantime, she is taking over-the-counter methyl folate.  Patient denies increased energy with decreased need for sleep, no increased talkativeness, no racing thoughts, no impulsivity or risky behaviors, no increased spending, no increased libido, no grandiosity, no increased irritability or anger, and no hallucinations.  Denies dizziness, syncope, seizures, numbness, tingling, tremor, tics, unsteady gait, slurred speech, confusion. Denies muscle or joint pain, stiffness, or dystonia.  Individual Medical History/ Review of Systems: Changes? :No   Past medications for mental health diagnoses include: Wellbutrin, Prozac, Lithium, Seroquel, Trazodone, Klonopin, Xanax, Geodon, Risperdal, Saphris, Haldol, Trintellix, Hydroxyzine, Effexor,  Pristiq  Allergies: Patient has no known allergies.  Current Medications:  Current Outpatient Medications:    5-Hydroxytryptophan (5-HTP PO), Take by mouth., Disp: , Rfl:    ALPRAZolam (XANAX) 0.5 MG tablet, TAKE 1 TABLET(0.5 MG) BY MOUTH TWICE DAILY AS NEEDED FOR ANXIETY, Disp: 30 tablet, Rfl: 5   cholecalciferol (VITAMIN D3) 25 MCG (1000 UNIT) tablet, Take 1,000 Units by mouth daily., Disp: , Rfl:    magnesium oxide (MAG-OX) 400 MG tablet, Take 400 mg by mouth daily. prn, Disp: , Rfl:    Methylfol-Methylcob-Acetylcyst (CEREFOLIN NAC) 6-2-600 MG TABS, Take 1 capsule by mouth daily., Disp: 90 tablet, Rfl: 3   Multiple Vitamin (MULTIVITAMIN) tablet, Take 1 tablet by mouth daily., Disp: , Rfl:    Omega-3 1000 MG CAPS, Take by mouth., Disp: , Rfl:    Saccharomyces boulardii (PROBIOTIC) 250 MG CAPS, 1 capsule, Disp: , Rfl:    zinc gluconate 50 MG tablet, Take 50 mg by mouth daily., Disp: , Rfl:    desvenlafaxine (PRISTIQ) 100 MG 24 hr tablet, Take 1 tablet (100 mg total) by mouth daily., Disp: 90 tablet, Rfl: 1   traZODone (DESYREL) 50 MG tablet, Take 1 tablet (50 mg total) by mouth at bedtime as needed for sleep., Disp: 90 tablet, Rfl: 1   valACYclovir (VALTREX) 1000 MG tablet, Take 1 tablet (1,000 mg total) by mouth 3 (three) times daily., Disp: 21 tablet, Rfl: 0 Medication Side Effects: none  Family Medical/ Social History: Changes?  Left her previous job at [employer] Now works at [employer]. Really likes it. Making more money.  MENTAL HEALTH EXAM:  There were no vitals taken for this visit.There is no height or weight on file to calculate BMI.  General Appearance: Casual, Neat, Well Groomed and Obese  Eye Contact:  Good  Speech:  Clear and Coherent and Normal Rate  Volume:  Normal  Mood:  Euthymic  Affect:  Congruent  Thought Process:  Goal Directed and Descriptions of Associations: Intact  Orientation:  Full (Time, Place, and Person)  Thought Content: Logical    Suicidal Thoughts:  No  Homicidal Thoughts:  No  Memory:  WNL  Judgement:  Good  Insight:  Good  Psychomotor Activity:  Normal  Concentration:  Concentration: Good  Recall:  Good  Fund of Knowledge: Good  Language: Good  Assets:  Desire for Improvement  ADL's:  Intact  Cognition: WNL  Prognosis:  Good    DIAGNOSES:    ICD-10-CM   1. Major depression, recurrent, full remission ([place])  F33.42     2. MTHFR gene mutation  Z15.89     3. Generalized anxiety disorder  F41.1     4. PTSD (post-traumatic stress disorder)  F43.10         Receiving Psychotherapy: Yes   Carney Bern     RECOMMENDATIONS:  PDMP was reviewed.  Last Xanax filled 06/28/2021. I provided 20 minutes of face to face time during this encounter, including time spent before and after the visit in records review, medical decision making, counseling pertinent to today's visit, and charting.  She is doing great!  No medication changes are needed. Continue the ariel classes and other exercise as she likes.  Continue healthy diet.  Continue vitamin D 1000 IUs daily, magnesium, zinc, omega-3, and multivitamin. Restart Cerefolin NAC daily as soon as she can. Continue Xanax 0.5 mg, 1 p.o. twice daily as needed. Continue Pristiq 100 mg, 1 p.o. daily. Continue trazodone 50 mg, 1 p.o. nightly as needed sleep. Return in 6 months.  Donnal Moat, PA-C

## 2021-12-27 ENCOUNTER — Other Ambulatory Visit: Payer: Self-pay | Admitting: Physician Assistant

## 2022-01-07 ENCOUNTER — Other Ambulatory Visit: Payer: Self-pay | Admitting: Physician Assistant

## 2022-04-27 ENCOUNTER — Telehealth: Payer: Self-pay | Admitting: Physician Assistant

## 2022-04-27 ENCOUNTER — Other Ambulatory Visit: Payer: Self-pay

## 2022-04-27 MED ORDER — DESVENLAFAXINE SUCCINATE ER 100 MG PO TB24: 100.0000 mg | ORAL_TABLET | Freq: Every day | ORAL | 0 refills | Status: DC

## 2022-04-27 NOTE — Telephone Encounter (Signed)
Yes, it's ok.

## 2022-04-27 NOTE — Telephone Encounter (Signed)
Rx sent 

## 2022-04-27 NOTE — Telephone Encounter (Signed)
Ok to send 2 week supply?

## 2022-04-27 NOTE — Telephone Encounter (Signed)
Next visit is 05/08/22. Stephanie Bryan is requesting an RX for Desvenlafaxine 100 mg. She has no insurance and is requesting two week refill for this at the uninsured price. Her phone number is [phone].   Pharmacy is: [id number] - [place], [place] - [address] AT [facility] [address] [facility] [address]  Phone:  [phone]  Fax:  [phone]

## 2022-05-08 ENCOUNTER — Ambulatory Visit: Payer: 59 | Admitting: Physician Assistant

## 2022-05-24 ENCOUNTER — Ambulatory Visit: Payer: BLUE CROSS/BLUE SHIELD | Admitting: Physician Assistant

## 2022-05-24 ENCOUNTER — Encounter: Payer: Self-pay | Admitting: Physician Assistant

## 2022-05-24 DIAGNOSIS — Z1589 Genetic susceptibility to other disease: Secondary | ICD-10-CM

## 2022-05-24 DIAGNOSIS — F4310 Post-traumatic stress disorder, unspecified: Secondary | ICD-10-CM | POA: Diagnosis not present

## 2022-05-24 DIAGNOSIS — F411 Generalized anxiety disorder: Secondary | ICD-10-CM | POA: Diagnosis not present

## 2022-05-24 DIAGNOSIS — F3342 Major depressive disorder, recurrent, in full remission: Secondary | ICD-10-CM | POA: Diagnosis not present

## 2022-05-24 MED ORDER — DESVENLAFAXINE SUCCINATE ER 100 MG PO TB24: 100.0000 mg | ORAL_TABLET | Freq: Every day | ORAL | 1 refills | Status: DC

## 2022-05-24 NOTE — Progress Notes (Signed)
[facility]  Patient ID: Stephanie Bryan,  MRN: [id number]  PCP: Patient, No Pcp Per  Date of Evaluation: 05/24/2022 Time spent:15 minutes  Chief Complaint:  Chief Complaint   Anxiety; Depression; Insomnia; Follow-up     HISTORY/CURRENT STATUS: HPI For routine med check.   Doing great.  Still working out at the gym around 5 days/week and doing circus silk classes and loving it.  Her jobs that she has been in since January is going really well.  She has enjoyed working in her garden this summer.  Sleeps well most of the time.  She does have the trazodone to take if needed which is not very, now.  She occasionally has anxiety but not much.  She takes Xanax on occasion and it is helpful when needed.  Not having panic attacks but does sometimes get overwhelmed, that is when she needs it.  Patient is able to enjoy things.  Energy and motivation are good. No extreme sadness, tearfulness, or feelings of hopelessness.  ADLs and personal hygiene are normal.   Denies any changes in concentration, making decisions, or remembering things.  Appetite has not changed.  Weight is stable.  Denies suicidal or homicidal thoughts.  Patient denies increased energy with decreased need for sleep, increased talkativeness, racing thoughts, impulsivity or risky behaviors, increased spending, increased libido, grandiosity, increased irritability or anger, paranoia, or hallucinations.  Denies dizziness, syncope, seizures, numbness, tingling, tremor, tics, unsteady gait, slurred speech, confusion. Denies muscle or joint pain, stiffness, or dystonia.  Individual Medical History/ Review of Systems: Changes? :No   Past medications for mental health diagnoses include: Wellbutrin, Prozac, Lithium, Seroquel, Trazodone, Klonopin, Xanax, Geodon, Risperdal, Saphris, Haldol, Trintellix, Hydroxyzine, Effexor, Pristiq  Allergies: Patient has no known allergies.  Current Medications:  Current Outpatient  Medications:    5-Hydroxytryptophan (5-HTP PO), Take by mouth., Disp: , Rfl:    ALPRAZolam (XANAX) 0.5 MG tablet, TAKE 1 TABLET(0.5 MG) BY MOUTH TWICE DAILY AS NEEDED FOR ANXIETY, Disp: 30 tablet, Rfl: 2   cholecalciferol (VITAMIN D3) 25 MCG (1000 UNIT) tablet, Take 1,000 Units by mouth daily., Disp: , Rfl:    CREATINE PO, Take by mouth., Disp: , Rfl:    Ginger, Zingiber officinalis, (GINGER PO), Take by mouth., Disp: , Rfl:    l-methylfolate-B6-B12 (METANX) 3-35-2 MG TABS tablet, Take 1 tablet by mouth daily., Disp: , Rfl:    magnesium oxide (MAG-OX) 400 MG tablet, Take 400 mg by mouth daily. prn, Disp: , Rfl:    Multiple Vitamin (MULTIVITAMIN) tablet, Take 1 tablet by mouth daily., Disp: , Rfl:    Omega-3 1000 MG CAPS, Take by mouth., Disp: , Rfl:    Saccharomyces boulardii (PROBIOTIC) 250 MG CAPS, 1 capsule, Disp: , Rfl:    traZODone (DESYREL) 50 MG tablet, Take 1 tablet (50 mg total) by mouth at bedtime as needed for sleep., Disp: 90 tablet, Rfl: 1   ZINC OXIDE PO, Take by mouth., Disp: , Rfl:    desvenlafaxine (PRISTIQ) 100 MG 24 hr tablet, Take 1 tablet (100 mg total) by mouth daily., Disp: 90 tablet, Rfl: 1   Methylfol-Methylcob-Acetylcyst (CEREFOLIN NAC) 6-2-600 MG TABS, Take 1 capsule by mouth daily. (Patient not taking: Reported on 05/24/2022), Disp: 90 tablet, Rfl: 3   zinc gluconate 50 MG tablet, Take 50 mg by mouth daily., Disp: , Rfl:  Medication Side Effects: none  Family Medical/ Social History: Changes?  Left her previous job at [employer]. Now works at [employer]. Really likes  it. Making more money.    MENTAL HEALTH EXAM:  There were no vitals taken for this visit.There is no height or weight on file to calculate BMI.  General Appearance: Casual, Neat, Well Groomed and Obese  Eye Contact:  Good  Speech:  Clear and Coherent and Normal Rate  Volume:  Normal  Mood:  Euthymic  Affect:  Congruent  Thought Process:  Goal Directed and Descriptions of  Associations: Circumstantial  Orientation:  Full (Time, Place, and Person)  Thought Content: Logical   Suicidal Thoughts:  No  Homicidal Thoughts:  No  Memory:  WNL  Judgement:  Good  Insight:  Good  Psychomotor Activity:  Normal  Concentration:  Concentration: Good  Recall:  Good  Fund of Knowledge: Good  Language: Good  Assets:  Desire for Improvement  ADL's:  Intact  Cognition: WNL  Prognosis:  Good   DIAGNOSES:    ICD-10-CM   1. Major depression, recurrent, full remission (HCC)  F33.42     2. Generalized anxiety disorder  F41.1     3. PTSD (post-traumatic stress disorder)  F43.10     4. MTHFR gene mutation  Z15.89      Receiving Psychotherapy: Yes   Burney Gauze   RECOMMENDATIONS:  PDMP was reviewed.  Last Xanax filled 12/28/2021. I provided 15 minutes of face to face time during this encounter, including time spent before and after the visit in records review, medical decision making, counseling pertinent to today's visit, and charting.   She is doing great!  No change in meds needed.  Continue vitamin D 1000 IUs daily, magnesium, zinc, omega-3, and multivitamin, methylfoate. Continue Xanax 0.5 mg, 1 p.o. twice daily as needed. Continue Pristiq 100 mg, 1 p.o. daily. Continue trazodone 50 mg, 1 p.o. nightly as needed sleep. Rarely needs now. Return in 6 months.  Melony Overly, PA-C

## 2022-10-25 ENCOUNTER — Other Ambulatory Visit: Payer: Self-pay | Admitting: Physician Assistant

## 2022-11-22 ENCOUNTER — Encounter: Payer: Self-pay | Admitting: Physician Assistant

## 2022-11-22 ENCOUNTER — Ambulatory Visit (INDEPENDENT_AMBULATORY_CARE_PROVIDER_SITE_OTHER): Payer: No Typology Code available for payment source | Admitting: Physician Assistant

## 2022-11-22 DIAGNOSIS — G4700 Insomnia, unspecified: Secondary | ICD-10-CM

## 2022-11-22 DIAGNOSIS — Z1589 Genetic susceptibility to other disease: Secondary | ICD-10-CM | POA: Diagnosis not present

## 2022-11-22 DIAGNOSIS — F4310 Post-traumatic stress disorder, unspecified: Secondary | ICD-10-CM

## 2022-11-22 DIAGNOSIS — F411 Generalized anxiety disorder: Secondary | ICD-10-CM | POA: Diagnosis not present

## 2022-11-22 DIAGNOSIS — F3342 Major depressive disorder, recurrent, in full remission: Secondary | ICD-10-CM

## 2022-11-22 MED ORDER — ALPRAZOLAM 0.5 MG PO TABS: 0.5000 mg | ORAL_TABLET | Freq: Two times a day (BID) | ORAL | 5 refills | Status: DC | PRN

## 2022-11-22 MED ORDER — DESVENLAFAXINE SUCCINATE ER 100 MG PO TB24: 100.0000 mg | ORAL_TABLET | Freq: Every day | ORAL | 1 refills | Status: DC

## 2022-11-22 NOTE — Progress Notes (Signed)
[facility]  Patient ID: Stephanie Bryan,  MRN: [id number]  PCP: Patient, No Pcp Per  Date of Evaluation: 11/22/2022  time spent:20 minutes  Chief Complaint:  Chief Complaint   Anxiety; Depression; Insomnia; Follow-up    HISTORY/CURRENT STATUS: HPI For routine med check.   She's doing very well.  Patient is able to enjoy things.  Energy and motivation are good.  Work is going well.   No extreme sadness, tearfulness, or feelings of hopelessness.  Sleeps well, but admits to poor sleep hygiene at times. Will stay up late just b/c she wants some time to herself.  Trazodone is helpful when needed.  ADLs and personal hygiene are normal.   Denies any changes in concentration, making decisions, or remembering things.  Appetite has not changed.  Continues healthy wt loss. Eats pretty healthy.  Still exercises routinely, does arials.  Does not need the Xanax very often but when she does it is helpful.  States it is more of a safety net than anything.  Denies suicidal or homicidal thoughts.   Patient denies increased energy with decreased need for sleep, increased talkativeness, racing thoughts, impulsivity or risky behaviors, increased spending, increased libido, grandiosity, increased irritability or anger, paranoia, or hallucinations.  Denies dizziness, syncope, seizures, numbness, tingling, tremor, tics, unsteady gait, slurred speech, confusion. Denies muscle or joint pain, stiffness, or dystonia.   Individual Medical History/ Review of Systems: Changes? :No   Past medications for mental health diagnoses include: Wellbutrin, Prozac, Lithium, Seroquel, Trazodone, Klonopin, Xanax, Geodon, Risperdal, Saphris, Haldol, Trintellix, Hydroxyzine, Effexor, Pristiq  Allergies: Patient has no known allergies.  Current Medications:  Current Outpatient Medications:    5-Hydroxytryptophan (5-HTP PO), Take by mouth., Disp: , Rfl:    cholecalciferol (VITAMIN D3) 25 MCG (1000 UNIT)  tablet, Take 1,000 Units by mouth daily., Disp: , Rfl:    CREATINE PO, Take by mouth., Disp: , Rfl:    Ginger, Zingiber officinalis, (GINGER PO), Take by mouth., Disp: , Rfl:    l-methylfolate-B6-B12 (METANX) 3-35-2 MG TABS tablet, Take 1 tablet by mouth daily., Disp: , Rfl:    magnesium oxide (MAG-OX) 400 MG tablet, Take 400 mg by mouth daily. prn, Disp: , Rfl:    Methylfol-Methylcob-Acetylcyst (CEREFOLIN NAC) 6-2-600 MG TABS, Take 1 capsule by mouth daily., Disp: 90 tablet, Rfl: 3   Multiple Vitamin (MULTIVITAMIN) tablet, Take 1 tablet by mouth daily., Disp: , Rfl:    Omega-3 1000 MG CAPS, Take by mouth., Disp: , Rfl:    Saccharomyces boulardii (PROBIOTIC) 250 MG CAPS, 1 capsule, Disp: , Rfl:    traZODone (DESYREL) 50 MG tablet, Take 1 tablet (50 mg total) by mouth at bedtime as needed for sleep., Disp: 90 tablet, Rfl: 1   zinc gluconate 50 MG tablet, Take 50 mg by mouth daily., Disp: , Rfl:    ZINC OXIDE PO, Take by mouth., Disp: , Rfl:    ALPRAZolam (XANAX) 0.5 MG tablet, Take 1 tablet (0.5 mg total) by mouth 2 (two) times daily as needed for anxiety., Disp: 30 tablet, Rfl: 5   desvenlafaxine (PRISTIQ) 100 MG 24 hr tablet, Take 1 tablet (100 mg total) by mouth daily., Disp: 90 tablet, Rfl: 1 Medication Side Effects: none  Family Medical/ Social History: Changes? none  MENTAL HEALTH EXAM:  There were no vitals taken for this visit.There is no height or weight on file to calculate BMI.  General Appearance: Casual, Neat, Well Groomed and Obese  Eye Contact:  Good  Speech:  Clear and  Coherent and Normal Rate  Volume:  Normal  Mood:  Euthymic  Affect:  Congruent  Thought Process:  Goal Directed and Descriptions of Associations: Circumstantial  Orientation:  Full (Time, Place, and Person)  Thought Content: Logical   Suicidal Thoughts:  No  Homicidal Thoughts:  No  Memory:  WNL  Judgement:  Good  Insight:  Good  Psychomotor Activity:  Normal  Concentration:  Concentration: Good   Recall:  Good  Fund of Knowledge: Good  Language: Good  Assets:  Desire for Improvement Financial Resources/Insurance Housing Transportation Vocational/Educational  ADL's:  Intact  Cognition: WNL  Prognosis:  Good   DIAGNOSES:    ICD-10-CM   1. Major depression, recurrent, full remission ([place])  F33.42     2. Generalized anxiety disorder  F41.1     3. PTSD (post-traumatic stress disorder)  F43.10     4. Insomnia, unspecified type  G47.00     5. MTHFR gene mutation  Z15.89       Receiving Psychotherapy: Yes   Carney Bern   RECOMMENDATIONS:  PDMP was reviewed.  Last Xanax filled 10/26/2022 I provided 20 minutes of face to face time during this encounter, including time spent before and after the visit in records review, medical decision making, counseling pertinent to today's visit, and charting.   She is doing very well so no changes need to be made.  Continue Xanax 0.5 mg, 1 p.o. twice daily as needed. Continue Pristiq 100 mg, 1 p.o. daily. Continue trazodone 50 mg, 1 p.o. nightly as needed sleep. Rarely needs now. Continue vitamin D 1000 IUs daily, magnesium, zinc, omega-3, and multivitamin, methylfoate. Return in 6 months.  Donnal Moat, PA-C

## 2023-01-20 ENCOUNTER — Other Ambulatory Visit: Payer: Self-pay | Admitting: Physician Assistant

## 2023-05-23 ENCOUNTER — Ambulatory Visit: Payer: No Typology Code available for payment source | Admitting: Physician Assistant

## 2023-05-23 ENCOUNTER — Encounter: Payer: Self-pay | Admitting: Physician Assistant

## 2023-05-23 DIAGNOSIS — F3341 Major depressive disorder, recurrent, in partial remission: Secondary | ICD-10-CM | POA: Diagnosis not present

## 2023-05-23 DIAGNOSIS — F4310 Post-traumatic stress disorder, unspecified: Secondary | ICD-10-CM

## 2023-05-23 DIAGNOSIS — Z1589 Genetic susceptibility to other disease: Secondary | ICD-10-CM | POA: Diagnosis not present

## 2023-05-23 DIAGNOSIS — F411 Generalized anxiety disorder: Secondary | ICD-10-CM

## 2023-05-23 DIAGNOSIS — G4700 Insomnia, unspecified: Secondary | ICD-10-CM | POA: Diagnosis not present

## 2023-05-23 MED ORDER — ALPRAZOLAM 0.5 MG PO TABS: 0.5000 mg | ORAL_TABLET | Freq: Two times a day (BID) | ORAL | 5 refills | Status: AC | PRN

## 2023-05-23 MED ORDER — DESVENLAFAXINE SUCCINATE ER 100 MG PO TB24: 100.0000 mg | ORAL_TABLET | Freq: Every day | ORAL | 1 refills | Status: DC

## 2023-05-23 NOTE — Progress Notes (Signed)
[facility]  Patient ID: Stephanie Bryan,  MRN: [id number]  PCP: Patient, No Pcp Per  Date of Evaluation: 05/23/2023  time spent:20 minutes  Chief Complaint:  Chief Complaint   Anxiety; Depression; Follow-up    HISTORY/CURRENT STATUS: HPI For routine med check.   Doing really well. 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 Work is going fine. 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  She went to [place] over the summer to see her family and had a good time. Was also very stressful, just fm stuff. But it was good overall.  Energy and motivation are good.  No extreme sadness, tearfulness, or feelings of hopelessness.  Sleeps well most of the time. ADLs and personal hygiene are normal.   Denies any changes in concentration, making decisions, or remembering things.  Appetite has not changed.  Weight is stable.  Anxiety is well controlled.  She rarely has a panic attack.  Does get overwhelmed sometimes and takes Xanax if needed.  It is still effective.  Denies suicidal or homicidal thoughts.  Patient denies increased energy with decreased need for sleep, increased talkativeness, racing thoughts, impulsivity or risky behaviors, increased spending, increased libido, grandiosity, increased irritability or anger, paranoia, or hallucinations.  Denies dizziness, syncope, seizures, numbness, tingling, tremor, tics, unsteady gait, slurred speech, confusion. Denies muscle or joint pain, stiffness, or dystonia.   Individual Medical History/ Review of Systems: Changes? :No   Past medications for mental health diagnoses include: Wellbutrin, Prozac, Lithium, Seroquel, Trazodone, Klonopin, Xanax, Geodon, Risperdal, Saphris, Haldol, Trintellix, Hydroxyzine, Effexor, Pristiq  Allergies: Patient has no known allergies.  Current Medications:  Current Outpatient Medications:    cholecalciferol (VITAMIN D3) 25 MCG (1000 UNIT)  tablet, Take 1,000 Units by mouth daily., Disp: , Rfl:    CREATINE PO, Take by mouth., Disp: , Rfl:    Ginger, Zingiber officinalis, (GINGER PO), Take by mouth., Disp: , Rfl:    magnesium oxide (MAG-OX) 400 MG tablet, Take 400 mg by mouth daily. prn, Disp: , Rfl:    Methylfol-Methylcob-Acetylcyst (CEREFOLIN NAC) 6-2-600 MG TABS, Take 1 capsule by mouth daily., Disp: 90 tablet, Rfl: 3   Multiple Vitamin (MULTIVITAMIN) tablet, Take 1 tablet by mouth daily., Disp: , Rfl:    Omega-3 1000 MG CAPS, Take by mouth., Disp: , Rfl:    5-Hydroxytryptophan (5-HTP PO), Take by mouth. (Patient not taking: Reported on 05/23/2023), Disp: , Rfl:    ALPRAZolam (XANAX) 0.5 MG tablet, Take 1 tablet (0.5 mg total) by mouth 2 (two) times daily as needed for anxiety., Disp: 30 tablet, Rfl: 5   desvenlafaxine (PRISTIQ) 100 MG 24 hr tablet, Take 1 tablet (100 mg total) by mouth daily., Disp: 90 tablet, Rfl: 1   l-methylfolate-B6-B12 (METANX) 3-35-2 MG TABS tablet, Take 1 tablet by mouth daily. (Patient not taking: Reported on 05/23/2023), Disp: , Rfl:    Saccharomyces boulardii (PROBIOTIC) 250 MG CAPS, 1 capsule (Patient not taking: Reported on 05/23/2023), Disp: , Rfl:    traZODone (DESYREL) 50 MG tablet, Take 1 tablet (50 mg total) by mouth at bedtime as needed for sleep. (Patient not taking: Reported on 05/23/2023), Disp: 90 tablet, Rfl: 1   zinc gluconate 50 MG tablet, Take 50 mg by mouth daily. (Patient not taking: Reported on 05/23/2023), Disp: , Rfl:    ZINC OXIDE PO, Take by mouth. (Patient not taking: Reported on 05/23/2023), Disp: , Rfl:  Medication Side Effects: none  Family Medical/ Social History: Changes? none  MENTAL HEALTH EXAM:  There were no vitals taken for this visit.There is no height or weight on file to calculate BMI.  General Appearance: Casual, Neat, Well Groomed and Obese  Eye Contact:  Good  Speech:  Clear and Coherent and Normal Rate  Volume:  Normal  Mood:  Euthymic  Affect:  Congruent   Thought Process:  Goal Directed and Descriptions of Associations: Circumstantial  Orientation:  Full (Time, Place, and Person)  Thought Content: Logical   Suicidal Thoughts:  No  Homicidal Thoughts:  No  Memory:  WNL  Judgement:  Good  Insight:  Good  Psychomotor Activity:  Normal  Concentration:  Concentration: Good and Attention Span: Good  Recall:  Good  Fund of Knowledge: Good  Language: Good  Assets:  Desire for Improvement Financial Resources/Insurance Housing Transportation Vocational/Educational  ADL's:  Intact  Cognition: WNL  Prognosis:  Good   DIAGNOSES:    ICD-10-CM   1. Recurrent major depressive disorder, in partial remission (HCC)  F33.41     2. PTSD (post-traumatic stress disorder)  F43.10     3. Generalized anxiety disorder  F41.1     4. Insomnia, unspecified type  G47.00     5. MTHFR gene mutation  Z15.89       Receiving Psychotherapy: Yes   Burney Gauze   RECOMMENDATIONS:  PDMP was reviewed.  Last Xanax filled 04/12/2023. I provided 20 minutes of face to face time during this encounter, including time spent before and after the visit in records review, medical decision making, counseling pertinent to today's visit, and charting.   She is doing great so no changes need to be made.  Continue Xanax 0.5 mg, 1 p.o. twice daily as needed. Continue Pristiq 100 mg, 1 p.o. daily. Continue trazodone 50 mg, 1 p.o. nightly as needed sleep. Rarely needs now. Continue vitamin D 1000 IUs daily, magnesium, zinc, omega-3, and multivitamin, methylfoate. Return in 6 months.  Melony Overly, PA-C

## 2023-11-21 ENCOUNTER — Ambulatory Visit: Payer: No Typology Code available for payment source | Admitting: Physician Assistant

## 2023-11-22 ENCOUNTER — Other Ambulatory Visit: Payer: Self-pay | Admitting: Physician Assistant

## 2024-01-08 ENCOUNTER — Ambulatory Visit (INDEPENDENT_AMBULATORY_CARE_PROVIDER_SITE_OTHER): Admitting: Physician Assistant

## 2024-01-08 DIAGNOSIS — Z91199 Patient's noncompliance with other medical treatment and regimen due to unspecified reason: Secondary | ICD-10-CM

## 2024-01-08 NOTE — Progress Notes (Signed)
 No show

## 2024-02-23 ENCOUNTER — Other Ambulatory Visit: Payer: Self-pay | Admitting: Physician Assistant

## 2024-02-25 ENCOUNTER — Telehealth: Payer: Self-pay | Admitting: Physician Assistant

## 2024-02-25 MED ORDER — DESVENLAFAXINE SUCCINATE ER 100 MG PO TB24: 100.0000 mg | ORAL_TABLET | Freq: Every day | ORAL | 0 refills | Status: DC

## 2024-02-25 NOTE — Telephone Encounter (Signed)
 Pt called at 1:15p reuesting refill of generic Pristiq  XR 100mg  to   [id number] [place], [place] - [address] AT [facility] [address] [place], [place] [address] Phone: [phone]  Fax: [phone]   She is requesting 90 day script again. Next appt 7/23

## 2024-02-25 NOTE — Telephone Encounter (Signed)
 Filled for 60 days to get to appt. Has not been compliant with FU. Will not fill for 90 days until appt.

## 2024-04-02 ENCOUNTER — Ambulatory Visit (INDEPENDENT_AMBULATORY_CARE_PROVIDER_SITE_OTHER): Payer: Self-pay | Admitting: Physician Assistant

## 2024-04-02 ENCOUNTER — Encounter: Payer: Self-pay | Admitting: Physician Assistant

## 2024-04-02 DIAGNOSIS — Z1589 Genetic susceptibility to other disease: Secondary | ICD-10-CM | POA: Diagnosis not present

## 2024-04-02 DIAGNOSIS — F411 Generalized anxiety disorder: Secondary | ICD-10-CM

## 2024-04-02 DIAGNOSIS — F3342 Major depressive disorder, recurrent, in full remission: Secondary | ICD-10-CM | POA: Diagnosis not present

## 2024-04-02 MED ORDER — DESVENLAFAXINE SUCCINATE ER 100 MG PO TB24: 100.0000 mg | ORAL_TABLET | Freq: Every day | ORAL | 1 refills | Status: DC

## 2024-04-02 NOTE — Progress Notes (Signed)
 [facility]  Patient ID: Stephanie Bryan,  MRN: [id number]  PCP: Stephanie Bryan  Date of Evaluation: 04/02/2024 time spent:20 minutes  Chief Complaint:  Chief Complaint   Anxiety; Depression; Insomnia; Follow-up    HISTORY/CURRENT STATUS: HPI For routine med check.   Continues to do Stephanie Bryan and enjoying that. Another hobby is houseplants.  Work is going well.  Energy and motivation are good.  No extreme sadness, tearfulness, or feelings of hopelessness.  Sleeps well most of the time. ADLs and personal hygiene are normal.   Denies any changes in concentration, making decisions, or remembering things.  Appetite has not changed.  She occasionally takes Xanax  but not very often.  It is helpful when needed.  Weight is stable. No SI/HI.  Patient denies increased energy with decreased need for sleep, increased talkativeness, racing thoughts, impulsivity or risky behaviors, increased spending, increased libido, grandiosity, increased irritability or anger, paranoia, or hallucinations.  Denies dizziness, syncope, seizures, numbness, tingling, tremor, tics, unsteady gait, slurred speech, confusion. Denies muscle or joint pain, stiffness, or dystonia.  Individual Medical History/ Review of Systems: Changes? :No   Past medications for mental health diagnoses include: Wellbutrin , Prozac, Lithium , Seroquel, Trazodone , Klonopin, Xanax , Geodon, Risperdal, Saphris, Haldol, Trintellix , Hydroxyzine , Effexor, Pristiq   Allergies: Patient has no known allergies.  Current Medications:  Current Outpatient Medications:    ALPRAZolam  (XANAX ) 0.5 MG tablet, Take 1 tablet (0.5 mg total) by mouth 2 (two) times daily as needed for anxiety., Disp: 30 tablet, Rfl: 5   cholecalciferol (VITAMIN D3) 25 MCG (1000 UNIT) tablet, Take 1,000 Units by mouth daily., Disp: , Rfl:    CREATINE PO, Take by mouth., Disp: , Rfl:    Ginger, Zingiber officinalis, (GINGER PO), Take by mouth., Disp: , Rfl:     magnesium oxide (MAG-OX) 400 MG tablet, Take 400 mg by mouth daily. prn, Disp: , Rfl:    Methylfol-Methylcob-Acetylcyst (CEREFOLIN NAC) 6-2-600 MG TABS, Take 1 capsule by mouth daily., Disp: 90 tablet, Rfl: 3   Multiple Vitamin (MULTIVITAMIN) tablet, Take 1 tablet by mouth daily., Disp: , Rfl:    Omega-3 1000 MG CAPS, Take by mouth., Disp: , Rfl:    5-Hydroxytryptophan (5-HTP PO), Take by mouth. (Patient not taking: Reported on 05/23/2023), Disp: , Rfl:    desvenlafaxine  (PRISTIQ ) 100 MG 24 hr tablet, Take 1 tablet (100 mg total) by mouth daily., Disp: 90 tablet, Rfl: 1   l-methylfolate-B6-B12 (METANX) 3-35-2 MG TABS tablet, Take 1 tablet by mouth daily. (Patient not taking: Reported on 05/23/2023), Disp: , Rfl:    Saccharomyces boulardii (PROBIOTIC) 250 MG CAPS, 1 capsule (Patient not taking: Reported on 05/23/2023), Disp: , Rfl:    zinc gluconate 50 MG tablet, Take 50 mg by mouth daily. (Patient not taking: Reported on 05/23/2023), Disp: , Rfl:    ZINC OXIDE PO, Take by mouth. (Patient not taking: Reported on 05/23/2023), Disp: , Rfl:  Medication Side Effects: none  Family Medical/ Social History: Changes? none  MENTAL HEALTH EXAM:  There were no vitals taken for this visit.There is no height or weight on file to calculate BMI.  General Appearance: Casual, Neat, Well Groomed and Obese  Eye Contact:  Good  Speech:  Clear and Coherent and Normal Rate  Volume:  Normal  Mood:  Euthymic  Affect:  Congruent  Thought Process:  Goal Directed and Descriptions of Associations: Circumstantial  Orientation:  Full (Time, Place, and Person)  Thought Content: Logical   Suicidal Thoughts:  No  Homicidal Thoughts:  No  Memory:  WNL  Judgement:  Good  Insight:  Good  Psychomotor Activity:  Normal  Concentration:  Concentration: Good and Attention Span: Good  Recall:  Good  Fund of Knowledge: Good  Language: Good  Assets:  Desire for Improvement Financial  Resources/Insurance Housing Transportation Vocational/Educational  ADL's:  Intact  Cognition: WNL  Prognosis:  Good   DIAGNOSES:    ICD-10-CM   1. Major depression, recurrent, full remission (HCC)  F33.42     2. Generalized anxiety disorder  F41.1     3. MTHFR gene mutation  Z15.89       Receiving Psychotherapy: Yes   [name]   RECOMMENDATIONS:  PDMP was reviewed.  Last Xanax  filled 07/03/2023. I provided approximately 20 minutes of face to face time during this encounter, including time spent before and after the visit in records review, medical decision making, counseling pertinent to today's visit, and charting.   [name] is doing really well so no changes need to be made.  Continue Xanax  0.5 mg, 1 p.o. twice daily as needed. Continue Pristiq  100 mg, 1 p.o. daily. Continue vitamin D 1000 IUs daily, magnesium, zinc, omega-3, and multivitamin, methylfoate. Return in 6 months.  Verneita Cooks, PA-C

## 2024-10-01 ENCOUNTER — Encounter: Payer: Self-pay | Admitting: Physician Assistant

## 2024-10-01 ENCOUNTER — Ambulatory Visit (INDEPENDENT_AMBULATORY_CARE_PROVIDER_SITE_OTHER): Admitting: Physician Assistant

## 2024-10-01 DIAGNOSIS — F3342 Major depressive disorder, recurrent, in full remission: Secondary | ICD-10-CM

## 2024-10-01 DIAGNOSIS — F4310 Post-traumatic stress disorder, unspecified: Secondary | ICD-10-CM

## 2024-10-01 DIAGNOSIS — F411 Generalized anxiety disorder: Secondary | ICD-10-CM | POA: Diagnosis not present

## 2024-10-01 MED ORDER — DESVENLAFAXINE SUCCINATE ER 100 MG PO TB24: 100.0000 mg | ORAL_TABLET | Freq: Every day | ORAL | 1 refills | Status: AC

## 2024-10-01 NOTE — Progress Notes (Signed)
 "     [facility]  Patient ID: Stephanie Bryan,  MRN: [id number]  PCP: Patient, No Pcp Per  Date of Evaluation: 10/01/2024 Time spent:20 minutes  Chief Complaint:  Chief Complaint   Anxiety; Depression; Follow-up    HISTORY/CURRENT STATUS: HPI For routine med check.   Stephanie Bryan is doing very well.  Work is going fine.  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  Energy and motivation are good.  No extreme sadness, tearfulness, or feelings of hopelessness.  Sleeps well most of the time. ADLs and personal hygiene are normal.   Denies any changes in concentration, making decisions, or remembering things.  She has lost weight on purpose, she is eating healthier and not snacking as much.  No mania, delirium, AH/VH.  No SI/HI.  Individual Medical History/ Review of Systems: Changes? :No   Past medications for mental health diagnoses include: Wellbutrin , Prozac, Lithium , Seroquel, Trazodone , Klonopin, Xanax , Geodon, Risperdal, Saphris, Haldol, Trintellix , Hydroxyzine , Effexor, Pristiq   Allergies: Patient has no known allergies.  Current Medications:  Current Outpatient Medications:    ALPRAZolam  (XANAX ) 0.5 MG tablet, Take 1 tablet (0.5 mg total) by mouth 2 (two) times daily as needed for anxiety., Disp: 30 tablet, Rfl: 5   cholecalciferol (VITAMIN D3) 25 MCG (1000 UNIT) tablet, Take 1,000 Units by mouth daily., Disp: , Rfl:    CREATINE PO, Take by mouth., Disp: , Rfl:    Ginger, Zingiber officinalis, (GINGER PO), Take by mouth., Disp: , Rfl:    magnesium oxide (MAG-OX) 400 MG tablet, Take 400 mg by mouth daily. prn, Disp: , Rfl:    Methylfol-Methylcob-Acetylcyst (CEREFOLIN NAC) 6-2-600 MG TABS, Take 1 capsule by mouth daily., Disp: 90 tablet, Rfl: 3   Multiple Vitamin (MULTIVITAMIN) tablet, Take 1 tablet by mouth daily., Disp: , Rfl:    Omega-3 1000 MG CAPS, Take by mouth., Disp: , Rfl:     5-Hydroxytryptophan (5-HTP PO), Take by mouth. (Patient not taking: Reported on 05/23/2023), Disp: , Rfl:    desvenlafaxine  (PRISTIQ ) 100 MG 24 hr tablet, Take 1 tablet (100 mg total) by mouth daily., Disp: 90 tablet, Rfl: 1   l-methylfolate-B6-B12 (METANX) 3-35-2 MG TABS tablet, Take 1 tablet by mouth daily. (Patient not taking: Reported on 05/23/2023), Disp: , Rfl:    Saccharomyces boulardii (PROBIOTIC) 250 MG CAPS, 1 capsule (Patient not taking: Reported on 05/23/2023), Disp: , Rfl:    zinc gluconate 50 MG tablet, Take 50 mg by mouth daily. (Patient not taking: Reported on 05/23/2023), Disp: , Rfl:    ZINC OXIDE PO, Take by mouth. (Patient not taking: Reported on 05/23/2023), Disp: , Rfl:  Medication Side Effects: none  Family Medical/ Social History: Changes? Her 22 yo cat has a growth under her tongue, will have bx in 2 weeks.   MENTAL HEALTH EXAM:  There were no vitals taken for this visit.There is no height or weight on file to calculate BMI.  General Appearance: Casual, Neat, Well Groomed and Obese  Eye Contact:  Good  Speech:  Clear and Coherent and Normal Rate  Volume:  Normal  Mood:  Euthymic  Affect:  Congruent  Thought Process:  Goal Directed and Descriptions of Associations: Circumstantial  Orientation:  Full (Time, Place, and Person)  Thought Content: Logical   Suicidal Thoughts:  No  Homicidal Thoughts:  No  Memory:  WNL  Judgement:  Good  Insight:  Good  Psychomotor Activity:  Normal  Concentration:  Concentration: Good and Attention Span: Good  Recall:  Good  Fund of Knowledge: Good  Language: Good  Assets:  Communication Skills Desire for Improvement Financial Resources/Insurance Housing Resilience Transportation Vocational/Educational  ADL's:  Intact  Cognition: WNL  Prognosis:  Good   DIAGNOSES:    ICD-10-CM   1. Major depression, recurrent, full remission  F33.42     2. Generalized anxiety disorder  F41.1     3. PTSD (post-traumatic stress disorder)   F43.10       Receiving Psychotherapy: Yes   [name]   RECOMMENDATIONS:  PDMP was reviewed.  Last Xanax  filled 07/03/2023. I provided approximately  20 minutes of face to face time during this encounter, including time spent before and after the visit in records review, medical decision making, counseling pertinent to today's visit, and charting.   She is doing very well so no changes need to be made.  At some point she would like to try decreasing the Pristiq  or even going off it if appropriate.  It is fine if she wants to drop the Pristiq  back to 2 mg, we can do that over the phone before her next appointment.  However I do not recommend making changes right now with her cat being sick and not knowing what the biopsy will show.  She understands and agrees.  Continue Xanax  0.5 mg, 1 p.o. twice daily as needed. Continue Pristiq  100 mg, 1 p.o. daily. Continue vitamin D 1000 IUs daily, magnesium, zinc, omega-3, and multivitamin, methylfoate. Return in 6 months.  Verneita Cooks, PA-C  "

## 2025-04-01 ENCOUNTER — Ambulatory Visit (INDEPENDENT_AMBULATORY_CARE_PROVIDER_SITE_OTHER): Admitting: Physician Assistant
